# Patient Record
Sex: Female | Born: 1960
Health system: Southern US, Community
[De-identification: ages and names within clinical notes are randomized; demographics above are authoritative.]

## PROBLEM LIST (undated history)

## (undated) DIAGNOSIS — T7840XA Allergy, unspecified, initial encounter: Secondary | ICD-10-CM

## (undated) DIAGNOSIS — E119 Type 2 diabetes mellitus without complications: Secondary | ICD-10-CM

## (undated) DIAGNOSIS — K219 Gastro-esophageal reflux disease without esophagitis: Secondary | ICD-10-CM

## (undated) HISTORY — DX: Allergy, unspecified, initial encounter: T78.40XA

## (undated) HISTORY — DX: Type 2 diabetes mellitus without complications: E11.9

## (undated) HISTORY — PX: TUBAL LIGATION: SHX77

---

## 1999-03-25 ENCOUNTER — Other Ambulatory Visit: Admission: RE | Admit: 1999-03-25 | Discharge: 1999-03-25 | Payer: Self-pay | Admitting: Gynecology

## 2001-11-28 ENCOUNTER — Emergency Department (HOSPITAL_COMMUNITY): Admission: EM | Admit: 2001-11-28 | Discharge: 2001-11-28 | Payer: Self-pay | Admitting: Emergency Medicine

## 2001-11-28 ENCOUNTER — Encounter: Payer: Self-pay | Admitting: Internal Medicine

## 2002-03-28 ENCOUNTER — Ambulatory Visit (HOSPITAL_COMMUNITY): Admission: RE | Admit: 2002-03-28 | Discharge: 2002-03-28 | Payer: Self-pay | Admitting: Infectious Diseases

## 2002-03-28 ENCOUNTER — Encounter: Payer: Self-pay | Admitting: Infectious Diseases

## 2002-12-12 ENCOUNTER — Other Ambulatory Visit: Admission: RE | Admit: 2002-12-12 | Discharge: 2002-12-12 | Payer: Self-pay | Admitting: Family Medicine

## 2002-12-19 ENCOUNTER — Ambulatory Visit (HOSPITAL_COMMUNITY): Admission: RE | Admit: 2002-12-19 | Discharge: 2002-12-19 | Payer: Self-pay | Admitting: Family Medicine

## 2002-12-19 ENCOUNTER — Encounter: Payer: Self-pay | Admitting: Family Medicine

## 2004-02-08 ENCOUNTER — Other Ambulatory Visit: Admission: RE | Admit: 2004-02-08 | Discharge: 2004-02-08 | Payer: Self-pay | Admitting: Family Medicine

## 2005-04-17 ENCOUNTER — Other Ambulatory Visit: Admission: RE | Admit: 2005-04-17 | Discharge: 2005-04-17 | Payer: Self-pay | Admitting: Family Medicine

## 2006-04-18 ENCOUNTER — Other Ambulatory Visit: Admission: RE | Admit: 2006-04-18 | Discharge: 2006-04-18 | Payer: Self-pay | Admitting: Family Medicine

## 2007-04-22 ENCOUNTER — Other Ambulatory Visit: Admission: RE | Admit: 2007-04-22 | Discharge: 2007-04-22 | Payer: Self-pay | Admitting: Family Medicine

## 2008-04-03 ENCOUNTER — Encounter: Admission: RE | Admit: 2008-04-03 | Discharge: 2008-04-03 | Payer: Self-pay | Admitting: Cardiology

## 2009-10-29 ENCOUNTER — Other Ambulatory Visit: Admission: RE | Admit: 2009-10-29 | Discharge: 2009-10-29 | Payer: Self-pay | Admitting: Family Medicine

## 2010-08-12 NOTE — Consult Note (Signed)
[name], Delgado NO.:  [id number]   MEDICAL RECORD NO.:  [id number]                   PATIENT TYPE:  EMS   LOCATION:  [facility]                                 FACILITY:  [facility]   PHYSICIAN:  Jackie Plum, M.D.             DATE OF BIRTH:  October 05, 1960   DATE OF CONSULTATION:  11/28/2001  DATE OF DISCHARGE:                                   CONSULTATION   CHIEF COMPLAINT:  Shortness of breath, palpitations, dizziness.   HISTORY OF PRESENT ILLNESS:  A 50 year old ________ lady with history of  diabetes mellitus, who presented with shortness of breath, palpitations,  dizziness of about two hours' duration while she was in bed sleeping.  No  history of nausea, vomiting, chest pain, cough, or sputum production.  NO  history of fever but experienced transient shaking chills.  No history of  vertiginous component to dizziness, no history of loss of consciousness.  According to the patient's husband, this is the third time the patient has  experienced such an episode of palpitations and dizziness with some  shortness of breath over the last two years.  Symptoms were improved at the  time of my evaluation and actually resolved prior to my discharge from the  ED.   PAST MEDICAL HISTORY:  Notable for diabetes mellitus for three years on  Glucophage.  No history of heart disease, hypertension, or  hypercholesterolemia.   ALLERGIES:  No history of medication allergies.   MEDICATIONS:  The patient takes Glucophage 1500 mg p.o. q.d. (the patient  did not take any medication last night because her medication pills had  expired).   FAMILY HISTORY:  Unremarkable for any heart disease or hypertension.   SOCIAL HISTORY:  The patient is married with two children.  She is a Consulting civil engineer  at [employer].  Her husband is a professor at the business school  at [employer].  She does not smoke cigarettes and does not drink alcohol.   REVIEW OF SYMPTOMS:  Significant  positives and negatives are as in HPI.  Otherwise they were unremarkable.   PHYSICAL EXAMINATION:  VITAL SIGNS:  BP standing was 110/80, lying was  120/90, pulse rate standing was 83 and lying was 80, respiratory rate of 22  per minute, temperature 97.6 degrees Fahrenheit, O2 saturation of 99% on  room air.  GENERAL:  The patient was not toxic-looking.  She was not in acute  cardiopulmonary distress.  HEENT:  Normocephalic, atraumatic head.  Pupils were equal, round, and  reactive to light.  Extraocular movements were intact.  Oropharynx was moist  without any exudation.  NECK:  No thyromegaly, JVD, or cervical lymphadenopathy.  Neck was supple.  CHEST:  Vesicular breath sounds without any rales or wheezes.  CARDIAC:  Regular rate and rhythm, S2 and S2 are present.  No gallops or  murmurs.  ABDOMEN:  Full, normoactive bowel sounds were present,  nontender, no  hepatosplenomegaly.  EXTREMITIES:  No cyanosis or edema.  Dorsalis pedis pulses were present.  They were full, about 2+.  NEUROLOGIC:  The patient was alert and oriented x3.  No obvious focal  deficits.   LABORATORY DATA:  EKG was notable for normal sinus rhythm at 75 per minute,  no acute ST-T wave changes.  Chest CT discussed with Dr. Kearney Hard of radiology  negative for PE, no acute pulmonary process.   WBC count was 7.2, hemoglobin 14.4, hematocrit 32.7, MCV 92.1, platelet  count 102.  Sodium 137, potassium 3.9, chloride 104, CO2 26, BUN 18,  creatinine 0.9, glucose was 140.  Calcium was 9.5.  Albumin 3.7, total  protein 6.9, SGOT 21, SGPT 25, alkaline phosphatase 60, total bilirubin 0.5.  UA was negative for any UTI.  Pregnancy test was negative.  CPK total was  102, MB was 1.2.  Hemoglobin A1C is pending.   ASSESSMENT:  This is a 50 year old lady with a history of diabetes, no prior  significant cardiopulmonary disease, who presents with acute onset of  shortness of breath with palpitations and dizziness, without any  vertiginous  component.  This is the third episode of such symptomatology over the last  two years.  The patient had  not taken her medication of Glucophage and had  experienced those symptoms a few minutes after dinner.  Workup as mentioned  above was unremarkable at this point, because of patient's transitory  symptomatology is unclear.  The possibilities include an episode of  hyperglycemia or hypoglycemia.   PLAN:  The patient will be sent home on aspirin and to schedule an  outpatient stress test with negative chest CT, on account of recurrence of  this episode.  The patient is discharged home in stable condition.  She is  hemodynamically stable.  She does not have any symptomatology with respect  to shortness of breath, dizziness, palpitations.  Her symptoms had  completely resolved.                                               Jackie Plum, M.D.    GO/MEDQ  D:  11/28/2001  T:  11/28/2001  Job:  [id number]   cc:   Duncan Dull, M.D.  [address]  [place]  [place] [address]  Fax: [phone]

## 2011-02-21 ENCOUNTER — Other Ambulatory Visit: Payer: Self-pay | Admitting: Family Medicine

## 2011-02-21 ENCOUNTER — Other Ambulatory Visit (HOSPITAL_COMMUNITY): Payer: Self-pay | Admitting: Family Medicine

## 2011-02-21 DIAGNOSIS — R2230 Localized swelling, mass and lump, unspecified upper limb: Secondary | ICD-10-CM

## 2011-02-21 DIAGNOSIS — IMO0002 Reserved for concepts with insufficient information to code with codable children: Secondary | ICD-10-CM

## 2011-02-28 ENCOUNTER — Other Ambulatory Visit (HOSPITAL_COMMUNITY): Payer: Self-pay | Admitting: Family Medicine

## 2011-02-28 ENCOUNTER — Ambulatory Visit (HOSPITAL_COMMUNITY)
Admission: RE | Admit: 2011-02-28 | Discharge: 2011-02-28 | Disposition: A | Discharge status: Home / Self Care | Payer: 59 | Source: Ambulatory Visit | Attending: Family Medicine | Admitting: Family Medicine

## 2011-02-28 DIAGNOSIS — R2230 Localized swelling, mass and lump, unspecified upper limb: Secondary | ICD-10-CM

## 2011-02-28 DIAGNOSIS — R229 Localized swelling, mass and lump, unspecified: Secondary | ICD-10-CM | POA: Insufficient documentation

## 2012-01-03 ENCOUNTER — Ambulatory Visit
Admission: RE | Admit: 2012-01-03 | Discharge: 2012-01-03 | Disposition: A | Discharge status: Home / Self Care | Payer: No Typology Code available for payment source | Source: Ambulatory Visit | Attending: Infectious Diseases | Admitting: Infectious Diseases

## 2012-01-03 ENCOUNTER — Other Ambulatory Visit: Payer: Self-pay | Admitting: Infectious Diseases

## 2012-01-03 DIAGNOSIS — R7612 Nonspecific reaction to cell mediated immunity measurement of gamma interferon antigen response without active tuberculosis: Secondary | ICD-10-CM

## 2013-01-30 ENCOUNTER — Other Ambulatory Visit: Payer: Self-pay | Admitting: Family Medicine

## 2013-01-30 ENCOUNTER — Other Ambulatory Visit (HOSPITAL_COMMUNITY)
Admission: RE | Admit: 2013-01-30 | Discharge: 2013-01-30 | Disposition: A | Discharge status: Home / Self Care | Payer: 59 | Source: Ambulatory Visit | Attending: Family Medicine | Admitting: Family Medicine

## 2013-01-30 DIAGNOSIS — Z124 Encounter for screening for malignant neoplasm of cervix: Secondary | ICD-10-CM | POA: Insufficient documentation

## 2013-01-30 DIAGNOSIS — Z1151 Encounter for screening for human papillomavirus (HPV): Secondary | ICD-10-CM | POA: Insufficient documentation

## 2013-05-03 ENCOUNTER — Encounter: Payer: 59 | Attending: Family Medicine

## 2013-05-03 ENCOUNTER — Ambulatory Visit: Payer: 59

## 2013-05-03 VITALS — Ht 65.0 in | Wt 207.9 lb

## 2013-05-03 DIAGNOSIS — E119 Type 2 diabetes mellitus without complications: Secondary | ICD-10-CM | POA: Insufficient documentation

## 2013-05-03 NOTE — Progress Notes (Signed)
Patient was seen on 05/03/13 for the complete diabetes self-management series at the [facility].  Current A1c = unknown   Handouts given during class include:  Living Well with Diabetes book  Carb Counting and Meal Planning book  Meal Plan Card  Carbohydrate guide  Meal planning worksheet  Low Sodium Flavoring Tips  The diabetes portion plate  Low Carbohydrate Snack Suggestions  A1c to eAG Conversion Chart  Diabetes Medications  Stress Management  Diabetes Recommended Care Schedule  Diabetes Success Plan  Core Class Satisfaction Survey  The following learning objectives were met by the patient during this course:  Describe diabetes  State some common risk factors for diabetes  Defines the role of glucose and insulin  Identifies type of diabetes and pathophysiology  Describe the relationship between diabetes and cardiovascular risk  State the members of the Healthcare Team  States the rationale for glucose monitoring  State when to test glucose  State their individual Target Range  State the importance of logging glucose readings  Describe how to interpret glucose readings  Identifies A1C target  Explain the correlation between A1c and eAG values  State symptoms and treatment of high blood glucose  State symptoms and treatment of low blood glucose  Explain proper technique for glucose testing  Identifies proper sharps disposal  Describe the role of different macronutrients on glucose  Explain how carbohydrates affect blood glucose  State what foods contain the most carbohydrates  Demonstrate carbohydrate counting  Demonstrate how to read Nutrition Facts food label  Describe effects of various fats on heart health  Describe the importance of good nutrition for health and healthy eating strategies  Describe techniques for managing your shopping, cooking and meal planning  List strategies to follow meal plan  when dining out  Describe the effects of alcohol on glucose and how to use it safely   State the amount of activity recommended for healthy living   Describe activities suitable for individual needs   Identify ways to regularly incorporate activity into daily life   Identify barriers to activity and ways to over come these barriers  Identify diabetes medications being personally used and their primary action for lowering glucose and possible side effects   Describe role of stress on blood glucose and develop strategies to address psychosocial issues   Identify diabetes complications and ways to prevent them  Explain how to manage diabetes during illness   Evaluate success in meeting personal goal   Establish 2-3 goals that they will plan to diligently work on until they return for the  15-monthfollow-up visit  Goals:  Follow Diabetes Meal Plan as instructed  Eat 3 meals and 2 snacks, every 3-5 hrs  Limit carbohydrate intake to 45 grams carbohydrate/meal Limit carbohydrate intake to 15 grams carbohydrate/snack Add lean protein foods to meals/snacks  Monitor glucose levels as instructed by your doctor  Aim for 15-30 mins of physical activity daily as tolerated  Bring food record and glucose log to your next nutrition visit  Your patient has established the following 4 month goals in their individualized success plan:  Count carbohydrates at most meals and snacks  Reduce fat in my diet by eating less fried foods  Be active 30 minutes or more 4 times a week  Your patient has identified these potential barriers to change:  Work schedule  Doesn't cook when she works  Your patient has identified their diabetes self-care support plan as  webistes  husband  [place] support group

## 2013-06-23 ENCOUNTER — Other Ambulatory Visit: Payer: Self-pay | Admitting: Gastroenterology

## 2013-07-22 ENCOUNTER — Encounter (HOSPITAL_COMMUNITY): Payer: Self-pay | Admitting: Pharmacy Technician

## 2013-07-24 ENCOUNTER — Ambulatory Visit: Payer: Self-pay | Admitting: Podiatry

## 2013-08-05 ENCOUNTER — Encounter (HOSPITAL_COMMUNITY): Payer: Self-pay | Admitting: *Deleted

## 2013-08-06 ENCOUNTER — Encounter: Payer: Self-pay | Admitting: Podiatry

## 2013-08-06 ENCOUNTER — Ambulatory Visit (INDEPENDENT_AMBULATORY_CARE_PROVIDER_SITE_OTHER): Payer: 59 | Admitting: Podiatry

## 2013-08-06 ENCOUNTER — Ambulatory Visit (INDEPENDENT_AMBULATORY_CARE_PROVIDER_SITE_OTHER): Payer: 59

## 2013-08-06 VITALS — BP 118/74 | HR 87 | Resp 16

## 2013-08-06 DIAGNOSIS — M779 Enthesopathy, unspecified: Secondary | ICD-10-CM

## 2013-08-06 DIAGNOSIS — IMO0002 Reserved for concepts with insufficient information to code with codable children: Secondary | ICD-10-CM

## 2013-08-06 NOTE — Progress Notes (Signed)
Subjective:     Patient ID: Mary Delgado, female   DOB: 17-Apr-1960, 53 y.o.   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 Has tried warming pads without relief   Review of Systems  All other systems reviewed and are negative.      Objective:   Physical Exam  Nursing note and vitals reviewed. Constitutional: She is oriented to person, place, and time.  Cardiovascular: Intact distal pulses.   Musculoskeletal: Normal range of motion.  Neurological: She is oriented to person, place, and time.  Skin: Skin is dry.   neurovascular status intact with range of motion mildly diminished and muscle strength within normal limits both feet. Patient is found to have discomfort in the second metatarsal phalangeal joint right over left with inflammation noted and is found to have good digital perfusion with no indications of other pathology     Assessment:     Capsulitis with long-term diabetic who appears to have good foot function at this time    Plan:     H&P and x-rays were reviewed and at this time I recommended orthotics to reduce stress against the plantar feet. Scanned for orthotics at this time

## 2013-08-06 NOTE — Progress Notes (Signed)
   Subjective:    Patient ID: Mary Delgado, female    DOB: November 22, 1960, 53 y.o.   MRN: [id number]  HPI Comments: "I have pain under the toes"  Patient c/o burning, aching plantar forefoot bilateral, right over left, for about 1-2 months. She only experiences this when she has been on her feet for long periods. She works 12 hr shifts and is very uncomfortable when she leaves work. Tried Vicks rub and "warming pads"-states that the warming pads help some.  Foot Pain      Review of Systems  Musculoskeletal: Positive for back pain.  Allergic/Immunologic: Positive for environmental allergies.  All other systems reviewed and are negative.      Objective:   Physical Exam        Assessment & Plan:

## 2013-08-12 ENCOUNTER — Encounter (HOSPITAL_COMMUNITY)
Admission: RE | Disposition: A | Discharge status: Home / Self Care | Payer: Self-pay | Source: Ambulatory Visit | Attending: Gastroenterology

## 2013-08-12 ENCOUNTER — Ambulatory Visit (HOSPITAL_COMMUNITY)
Admission: RE | Admit: 2013-08-12 | Discharge: 2013-08-12 | Disposition: A | Discharge status: Home / Self Care | Payer: 59 | Source: Ambulatory Visit | Attending: Gastroenterology | Admitting: Gastroenterology

## 2013-08-12 ENCOUNTER — Encounter (HOSPITAL_COMMUNITY): Payer: 59 | Admitting: Anesthesiology

## 2013-08-12 ENCOUNTER — Ambulatory Visit (HOSPITAL_COMMUNITY): Payer: 59 | Admitting: Anesthesiology

## 2013-08-12 ENCOUNTER — Encounter (HOSPITAL_COMMUNITY): Payer: Self-pay

## 2013-08-12 DIAGNOSIS — L259 Unspecified contact dermatitis, unspecified cause: Secondary | ICD-10-CM | POA: Insufficient documentation

## 2013-08-12 DIAGNOSIS — Z1211 Encounter for screening for malignant neoplasm of colon: Secondary | ICD-10-CM | POA: Insufficient documentation

## 2013-08-12 DIAGNOSIS — K219 Gastro-esophageal reflux disease without esophagitis: Secondary | ICD-10-CM | POA: Insufficient documentation

## 2013-08-12 DIAGNOSIS — J309 Allergic rhinitis, unspecified: Secondary | ICD-10-CM | POA: Insufficient documentation

## 2013-08-12 DIAGNOSIS — E11319 Type 2 diabetes mellitus with unspecified diabetic retinopathy without macular edema: Secondary | ICD-10-CM | POA: Insufficient documentation

## 2013-08-12 DIAGNOSIS — Z888 Allergy status to other drugs, medicaments and biological substances status: Secondary | ICD-10-CM | POA: Insufficient documentation

## 2013-08-12 DIAGNOSIS — E1139 Type 2 diabetes mellitus with other diabetic ophthalmic complication: Secondary | ICD-10-CM | POA: Insufficient documentation

## 2013-08-12 HISTORY — DX: Gastro-esophageal reflux disease without esophagitis: K21.9

## 2013-08-12 HISTORY — PX: COLONOSCOPY WITH PROPOFOL: SHX5780

## 2013-08-12 LAB — GLUCOSE, CAPILLARY: Glucose-Capillary: 189 mg/dL — ABNORMAL HIGH (ref 70–99)

## 2013-08-12 SURGERY — COLONOSCOPY WITH PROPOFOL
Anesthesia: Monitor Anesthesia Care

## 2013-08-12 MED ORDER — PROPOFOL 10 MG/ML IV BOLUS
INTRAVENOUS | Status: AC
  Filled 2013-08-12: qty 20

## 2013-08-12 MED ORDER — LACTATED RINGERS IV SOLN
INTRAVENOUS | Status: DC
  Administered 2013-08-12: 08:00:00 via INTRAVENOUS

## 2013-08-12 MED ORDER — SODIUM CHLORIDE 0.9 % IV SOLN: INTRAVENOUS | Status: DC

## 2013-08-12 MED ORDER — PROPOFOL 10 MG/ML IV BOLUS
INTRAVENOUS | Status: DC | PRN
  Administered 2013-08-12: 25 mg via INTRAVENOUS
  Administered 2013-08-12 (×2): 50 mg via INTRAVENOUS
  Administered 2013-08-12: 25 mg via INTRAVENOUS
  Administered 2013-08-12 (×2): 50 mg via INTRAVENOUS
  Administered 2013-08-12 (×3): 25 mg via INTRAVENOUS

## 2013-08-12 SURGICAL SUPPLY — 22 items

## 2013-08-12 NOTE — H&P (Signed)
  Procedure: Baseline screening colonoscopy  History: The patient is a 53 year old female born 1961/02/07. She is scheduled to undergo her first screening colonoscopy with polypectomy to prevent colon cancer.  Past medical history: Bilateral tubal ligation. Eczema. Type 2 diabetes mellitus with diabetic retinopathy. Allergic rhinitis. Gastroesophageal reflux.  Medication allergies: Metformin caused medication side effects. Byetta caused headaches and tachycardia  Exam: The patient is alert and lying comfortably on the endoscopy stretcher. Lungs are clear to auscultation. Cardiac exam reveals a regular rhythm. Abdomen is soft nontender to palpation  Plan: Proceed with baseline screening colonoscopy

## 2013-08-12 NOTE — Transfer of Care (Signed)
Immediate Anesthesia Transfer of Care Note  Patient: Mary Delgado  Procedure(s) Performed: Procedure(s): COLONOSCOPY WITH PROPOFOL (N/A)  Patient Location: PACU  Anesthesia Type:MAC  Level of Consciousness: awake, sedated and patient cooperative  Airway & Oxygen Therapy: Patient Spontanous Breathing and Patient connected to face mask oxygen  Post-op Assessment: Report given to PACU RN and Post -op Vital signs reviewed and stable  Post vital signs: Reviewed and stable  Complications: No apparent anesthesia complications

## 2013-08-12 NOTE — Anesthesia Preprocedure Evaluation (Signed)
Anesthesia Evaluation  Patient identified by MRN, date of birth, ID band Patient awake    Reviewed: Allergy & Precautions, H&P , NPO status , Patient's Chart, lab work & pertinent test results  Airway Mallampati: II TM Distance: >3 FB Neck ROM: Full    Dental no notable dental hx.    Pulmonary neg pulmonary ROS,  breath sounds clear to auscultation  Pulmonary exam normal       Cardiovascular negative cardio ROS  Rhythm:Regular Rate:Normal     Neuro/Psych negative neurological ROS  negative psych ROS   GI/Hepatic Neg liver ROS, GERD-  Medicated,  Endo/Other  diabetes, Insulin Dependent  Renal/GU negative Renal ROS  negative genitourinary   Musculoskeletal negative musculoskeletal ROS (+)   Abdominal   Peds negative pediatric ROS (+)  Hematology negative hematology ROS (+)   Anesthesia Other Findings   Reproductive/Obstetrics negative OB ROS                           Anesthesia Physical Anesthesia Plan  ASA: III  Anesthesia Plan: MAC   Post-op Pain Management:    Induction: Intravenous  Airway Management Planned: Nasal Cannula  Additional Equipment:   Intra-op Plan:   Post-operative Plan:   Informed Consent: I have reviewed the patients History and Physical, chart, labs and discussed the procedure including the risks, benefits and alternatives for the proposed anesthesia with the patient or authorized representative who has indicated his/her understanding and acceptance.   Dental advisory given  Plan Discussed with: CRNA and Surgeon  Anesthesia Plan Comments:         Anesthesia Quick Evaluation

## 2013-08-12 NOTE — Op Note (Signed)
Procedure: Baseline screening colonoscopy  Endoscopist: Earle Gell  Premedication: Propofol administered by anesthesia  Procedure: The patient was placed in the left lateral decubitus position. Anal inspection and digital rectal exam were normal. The Pentax pediatric colonoscope was introduced into the rectum and advanced to the cecum. A normal-appearing appendiceal orifice and ileocecal valve were identified. Colonic preparation for the exam today was good.  Rectum. Normal. Retroflexed view of the distal rectum normal  Sigmoid colon and descending colon normal  Splenic flexure. Normal  Transverse colon. Normal  Hepatic flexure. Normal  Ascending colon. Normal  Cecum and ileocecal valve. Normal  Assessment: Normal baseline screening colonoscopy  Recommendation: Schedule repeat screening colonoscopy in 10 years

## 2013-08-12 NOTE — Anesthesia Postprocedure Evaluation (Signed)
  Anesthesia Post-op Note  Patient: Mary Delgado  Procedure(s) Performed: Procedure(s) (LRB): COLONOSCOPY WITH PROPOFOL (N/A)  Patient Location: PACU  Anesthesia Type: MAC  Level of Consciousness: awake and alert   Airway and Oxygen Therapy: Patient Spontanous Breathing  Post-op Pain: mild  Post-op Assessment: Post-op Vital signs reviewed, Patient's Cardiovascular Status Stable, Respiratory Function Stable, Patent Airway and No signs of Nausea or vomiting  Last Vitals:  Filed Vitals:   08/12/13 0910  BP: 134/80  Pulse: 74  Temp:   Resp: 16    Post-op Vital Signs: stable   Complications: No apparent anesthesia complications

## 2013-08-13 ENCOUNTER — Encounter (HOSPITAL_COMMUNITY): Payer: Self-pay | Admitting: Gastroenterology

## 2013-08-29 ENCOUNTER — Other Ambulatory Visit: Payer: 59

## 2013-09-01 ENCOUNTER — Ambulatory Visit: Payer: 59 | Admitting: *Deleted

## 2013-09-01 DIAGNOSIS — M779 Enthesopathy, unspecified: Secondary | ICD-10-CM

## 2013-09-01 NOTE — Progress Notes (Signed)
   Subjective:    Patient ID: Mary Delgado, female    DOB: 11-Feb-1961, 53 y.o.   MRN: [id number]  HPI PICK UP ORTHOTICS AND GIVEN INSTRUCTION.    Review of Systems     Objective:   Physical Exam        Assessment & Plan:

## 2013-09-01 NOTE — Patient Instructions (Signed)

## 2014-02-05 ENCOUNTER — Other Ambulatory Visit: Payer: Self-pay

## 2014-02-05 DIAGNOSIS — Z1231 Encounter for screening mammogram for malignant neoplasm of breast: Secondary | ICD-10-CM

## 2014-02-10 ENCOUNTER — Other Ambulatory Visit: Payer: Self-pay | Admitting: Family Medicine

## 2014-02-13 ENCOUNTER — Ambulatory Visit (HOSPITAL_COMMUNITY)
Admission: RE | Admit: 2014-02-13 | Discharge: 2014-02-13 | Disposition: A | Discharge status: Home / Self Care | Payer: 59 | Source: Ambulatory Visit | Attending: Family Medicine | Admitting: Family Medicine

## 2014-02-13 ENCOUNTER — Other Ambulatory Visit (HOSPITAL_COMMUNITY): Payer: Self-pay | Admitting: Family Medicine

## 2014-02-13 DIAGNOSIS — M2550 Pain in unspecified joint: Secondary | ICD-10-CM

## 2014-02-13 DIAGNOSIS — M25511 Pain in right shoulder: Secondary | ICD-10-CM | POA: Diagnosis present

## 2014-02-13 DIAGNOSIS — M542 Cervicalgia: Secondary | ICD-10-CM | POA: Diagnosis present

## 2014-02-16 ENCOUNTER — Other Ambulatory Visit: Payer: Self-pay | Admitting: Obstetrics & Gynecology

## 2014-02-16 ENCOUNTER — Other Ambulatory Visit (HOSPITAL_COMMUNITY)
Admission: RE | Admit: 2014-02-16 | Discharge: 2014-02-16 | Disposition: A | Discharge status: Home / Self Care | Payer: 59 | Source: Ambulatory Visit | Attending: Obstetrics & Gynecology | Admitting: Obstetrics & Gynecology

## 2014-02-16 DIAGNOSIS — Z01419 Encounter for gynecological examination (general) (routine) without abnormal findings: Secondary | ICD-10-CM | POA: Diagnosis not present

## 2014-02-17 LAB — CYTOLOGY - PAP

## 2014-02-27 ENCOUNTER — Ambulatory Visit
Admission: RE | Admit: 2014-02-27 | Discharge: 2014-02-27 | Disposition: A | Discharge status: Home / Self Care | Payer: 59 | Source: Ambulatory Visit

## 2014-02-27 DIAGNOSIS — Z1231 Encounter for screening mammogram for malignant neoplasm of breast: Secondary | ICD-10-CM

## 2014-07-06 ENCOUNTER — Ambulatory Visit: Payer: Self-pay

## 2014-07-20 ENCOUNTER — Other Ambulatory Visit: Payer: Self-pay

## 2014-07-20 VITALS — BP 110/78 | HR 85 | Ht 64.5 in | Wt 206.2 lb

## 2014-07-20 DIAGNOSIS — E119 Type 2 diabetes mellitus without complications: Secondary | ICD-10-CM | POA: Insufficient documentation

## 2014-07-20 NOTE — Patient Instructions (Signed)
Diabetes and Exercise Exercising regularly is important. It is not just about losing weight. It has many health benefits, such as:  Improving your overall fitness, flexibility, and endurance.  Increasing your bone density.  Helping with weight control.  Decreasing your body fat.  Increasing your muscle strength.  Reducing stress and tension.  Improving your overall health. People with diabetes who exercise gain additional benefits because exercise:  Reduces appetite.  Improves the body's use of blood sugar (glucose).  Helps lower or control blood glucose.  Decreases blood pressure.  Helps control blood lipids (such as cholesterol and triglycerides).  Improves the body's use of the hormone insulin by:  Increasing the body's insulin sensitivity.  Reducing the body's insulin needs.  Decreases the risk for heart disease because exercising:  Lowers cholesterol and triglycerides levels.  Increases the levels of good cholesterol (such as high-density lipoproteins [HDL]) in the body.  Lowers blood glucose levels. YOUR ACTIVITY PLAN  Choose an activity that you enjoy and set realistic goals. Your health care provider or diabetes educator can help you make an activity plan that works for you. Exercise regularly as directed by your health care provider. This includes:  Performing resistance training twice a week such as push-ups, sit-ups, lifting weights, or using resistance bands.  Performing 150 minutes of cardio exercises each week such as walking, running, or playing sports.  Staying active and spending no more than 90 minutes at one time being inactive. Even short bursts of exercise are good for you. Three 10-minute sessions spread throughout the day are just as beneficial as a single 30-minute session. Some exercise ideas include:  Taking the dog for a walk.  Taking the stairs instead of the elevator.  Dancing to your favorite song.  Doing an exercise  video.  Doing your favorite exercise with a friend. RECOMMENDATIONS FOR EXERCISING WITH TYPE 1 OR TYPE 2 DIABETES   Check your blood glucose before exercising. If blood glucose levels are greater than 240 mg/dL, check for urine ketones. Do not exercise if ketones are present.  Avoid injecting insulin into areas of the body that are going to be exercised. For example, avoid injecting insulin into:  The arms when playing tennis.  The legs when jogging.  Keep a record of:  Food intake before and after you exercise.  Expected peak times of insulin action.  Blood glucose levels before and after you exercise.  The type and amount of exercise you have done.  Review your records with your health care provider. Your health care provider will help you to develop guidelines for adjusting food intake and insulin amounts before and after exercising.  If you take insulin or oral hypoglycemic agents, watch for signs and symptoms of hypoglycemia. They include:  Dizziness.  Shaking.  Sweating.  Chills.  Confusion.  Drink plenty of water while you exercise to prevent dehydration or heat stroke. Body water is lost during exercise and must be replaced.  Talk to your health care provider before starting an exercise program to make sure it is safe for you. Remember, almost any type of activity is better than none. Document Released: 06/03/2003 Document Revised: 07/28/2013 Document Reviewed: 08/20/2012 ExitCare Patient Information 2015 [employer]. This information is not intended to replace advice given to you by your health care provider. Make sure you discuss any questions you have with your health care provider.  

## 2014-07-20 NOTE — Patient Outreach (Signed)
[name]) Care Management   07/20/2014  Mary Delgado 1961-01-06 [id number]  Mary Delgado is an 54 y.o. female.   Member seen for follow up office visit for Link to Wellness program for self management of Type 2 diabetes   Subjective: member states that she is getting use to working day shift.  States that she is having difficulty getting away to eat lunch so she is sometimes not eating until 3 PM.  States that she is eating dinner when she gets home around 8:30PM.  States that her blood sugars are elevated after eating late and her morning sugars are up.  States that on the days she is off her blood sugars are in range.       Objective:   Review of Systems  All other systems reviewed and are negative.   Physical Exam  Filed Vitals:   07/20/14 1408  BP: 110/78  Pulse: 85   Filed Weights   07/20/14 1408  Weight: 206 lb 3.2 oz (93.532 kg)    Current Medications:   Current Outpatient Prescriptions  Medication Sig Dispense Refill  . aspirin EC 81 MG tablet Take 81 mg by mouth daily.    . Canagliflozin (INVOKANA) 100 MG TABS Take 300 mg by mouth daily.     . cetirizine (ZYRTEC) 10 MG tablet Take 10 mg by mouth as needed for allergies.    . hydroxypropyl methylcellulose (ISOPTO TEARS) 2.5 % ophthalmic solution Place 1 drop into both eyes 3 (three) times daily as needed for dry eyes.    . Insulin Aspart (NOVOLOG FLEXPEN [place]) Inject 10 Units into the skin 3 (three) times daily before meals.     . insulin glargine (LANTUS) 100 UNIT/ML injection Inject 20 Units into the skin at bedtime.     Marland Kitchen lisinopril (PRINIVIL,ZESTRIL) 10 MG tablet Take 10 mg by mouth every evening.     . Multiple Vitamin (MULTIVITAMIN WITH MINERALS) TABS tablet Take 1 tablet by mouth daily.    . naproxen sodium (ANAPROX) 220 MG tablet Take 220 mg by mouth as needed.    . pantoprazole (PROTONIX) 40 MG tablet Take 40 mg by mouth as needed.      No current facility-administered medications for this  visit.    Functional Status:   In your present state of health, do you have any difficulty performing the following activities: 07/20/2014  Hearing? N  Vision? N  Difficulty concentrating or making decisions? N  Walking or climbing stairs? N  Dressing or bathing? N  Doing errands, shopping? N    Fall/Depression Screening:    PHQ 2/9 Scores 07/20/2014  PHQ - 2 Score 0   [facility] CM Care Plan        Patient Outreach from 07/20/2014 in [place] Problem One  Elevated blood sugars related to dx of Type 2 DM as evidenced by hemoglobin A1C of 7.9    Care Plan for Problem One  Active   Interventions for Problem One Long Term Goal  Reviewed CHO counting and portion control especially for rice and potatoes, Instructed to work with her coworkers to arrange to take  the first lunch break and to try to eat  her dinner at 5 PM when at work,  Reinforced  to continue to  walk  4 times a week and to increase time if possible, Reviewed s/s of hypoglycemia and actions to take, Encouraged to discuss with Dr.Kerr about her elevated blood sugars on the  days she works and if she needs more  insulin on  her  08/04/14 appt with him   [facility] Long Term Goal (31-90 days)  Member will see a decrease in her Hemoglobin A1C within the next 90 days   [facility] Long Term Goal Start Date  07/20/14      Assessment:  Member seen for Link to Wellness follow up visit for self management of Type 2 DM, Member has been adjusting to new work schedule.  She has not been eating at regular times and she has been eating late.  Her CGBs have been higher on the days she works.  She has increased her walking to 4 times a week.  She never tried the [facility] and she is still taking Lantus insulin 20 units daily.    Plan:  Plan to limit carbohydrates to 30-45GM (2-3 servings) at meal and 15GM for snacks. Plan to eat at scheduled breaks  and eat snacks with protein in between breaks at work.  Plan to eat meal with carbohydrate and  protein.   Continue to check blood sugars 3-4 times a day with goal of 80-130 before meals and less than 180 1  to 2 hours after eating.   Plan to walk 4 times a week for 30 minutes.  Try to work up to 150 minutes a week.  Plan to see Dr. Buddy Duty  08/04/14       Plan to return to Link to Wellness in September 14, 2014 at 10:30AM

## 2014-08-13 ENCOUNTER — Ambulatory Visit
Admission: RE | Admit: 2014-08-13 | Discharge: 2014-08-13 | Disposition: A | Discharge status: Home / Self Care | Payer: 59 | Source: Ambulatory Visit | Attending: Family Medicine | Admitting: Family Medicine

## 2014-08-13 ENCOUNTER — Other Ambulatory Visit: Payer: Self-pay | Admitting: Family Medicine

## 2014-08-13 DIAGNOSIS — M79675 Pain in left toe(s): Secondary | ICD-10-CM

## 2014-08-28 ENCOUNTER — Other Ambulatory Visit: Payer: Self-pay | Admitting: Family Medicine

## 2014-09-14 ENCOUNTER — Ambulatory Visit: Payer: 59

## 2014-10-05 ENCOUNTER — Ambulatory Visit: Payer: 59

## 2014-10-08 ENCOUNTER — Other Ambulatory Visit: Payer: Self-pay

## 2014-10-08 VITALS — BP 138/80 | HR 78 | Resp 16 | Ht 65.5 in | Wt 203.6 lb

## 2014-10-08 DIAGNOSIS — E119 Type 2 diabetes mellitus without complications: Secondary | ICD-10-CM

## 2014-10-08 NOTE — Patient Outreach (Signed)
[facility]) Care Management   10/08/2014  Mary Delgado Jun 06, 1960 [id number]  Mary Delgado is an 54 y.o. female.   Member seen for follow up office visit for Link to Wellness program for self management of Type 2 diabetes  Subjective: Member states that she saw Dr.Kerr in May and he increased her Lantus and meal time insulin.  States that her blood sugars have been getting better.  States that she has been going to the gym at [employer] 3 times a week for 30 minutes and she walks in the park on her days off.  States she is trying to eat less rice and potatoes and is eating more green vegetables. States that she has had a few low readings when she has not eaten or did not eat enough.    Objective:   Review of Systems  All other systems reviewed and are negative. Reviewed glucometer 7 day average-155 14 day average-173 30 day average-177  Physical Exam  Today's Vitals   10/08/14 1001  BP: 138/80  Pulse: 78  Resp: 16  Height: 1.664 m (5' 5.5")  Weight: 203 lb 9.6 oz (92.352 kg)  SpO2: 99%  PainSc: 0-No pain    Current Medications:   Current Outpatient Prescriptions  Medication Sig Dispense Refill  . aspirin EC 81 MG tablet Take 81 mg by mouth daily.    . Canagliflozin (INVOKANA) 100 MG TABS Take 100 mg by mouth daily.     . cetirizine (ZYRTEC) 10 MG tablet Take 10 mg by mouth as needed for allergies.    . fluticasone (FLONASE) 50 MCG/ACT nasal spray Place 1 spray into both nostrils daily as needed for allergies or rhinitis.    . hydroxypropyl methylcellulose (ISOPTO TEARS) 2.5 % ophthalmic solution Place 1 drop into both eyes 3 (three) times daily as needed for dry eyes.    . Insulin Aspart (NOVOLOG FLEXPEN [place]) Inject 15 Units into the skin 3 (three) times daily before meals.     . insulin glargine (LANTUS) 100 UNIT/ML injection Inject 30 Units into the skin at bedtime.     Marland Kitchen lisinopril (PRINIVIL,ZESTRIL) 10 MG tablet Take 10 mg by mouth every evening.     .  Multiple Vitamin (MULTIVITAMIN WITH MINERALS) TABS tablet Take 1 tablet by mouth daily.    . naproxen sodium (ANAPROX) 220 MG tablet Take 220 mg by mouth as needed.    . pantoprazole (PROTONIX) 40 MG tablet Take 40 mg by mouth as needed.      No current facility-administered medications for this visit.    Functional Status:   In your present state of health, do you have any difficulty performing the following activities: 10/08/2014 07/20/2014  Hearing? N N  Vision? N N  Difficulty concentrating or making decisions? N N  Walking or climbing stairs? N N  Dressing or bathing? N N  Doing errands, shopping? N N    Fall/Depression Screening:    PHQ 2/9 Scores 10/08/2014 07/20/2014  PHQ - 2 Score 0 0   THN CM Care Plan Problem One        Patient Outreach from 10/08/2014 in [place] Problem One  Elevated blood sugars related to dx of Type 2 DM as evidenced by hemoglobin A1C of 7.9    Care Plan for Problem One  Active   THN Long Term Goal (31-90 days)  Member will see a decrease in her Hemoglobin A1C within the next 90 days  THN Long Term Goal Start Date  10/08/14 [facility] had Hemoglobin A1c of 8.3 and medications increased]   Interventions for Problem One Long Term Goal  Reviewed CHO counting and portion control especially for rice and potatoes,  Praised for going to gym and encouraged to increase number of days, time and intensity of work outs, Reviewed s/s of hypoglycemia and actions to take, Instructed to not skip meals or snacks,  Reinforced to keep 11/13/14 appointment with Dr.Kerr      Assessment:   Member seen for follow up office visit for Link to Wellness program for self management of Type 2 diabetes.  Member is not meeting goal of Hemoglobin A1C of 7 with last reading of 8.3.  Member reports increasing her exercise and cutting back on rice/potato portions.  Reports hypoglycemic episodes with CBGs in the low 70s after not eating meals or planned snacks.  Plan:   Plan to limit carbohydrates to 30-45GM (2-3 servings) at meal and 15GM for snacks. Plan to eat at scheduled breaks and eat snacks with protein in between breaks at work.  Plan to eat meal with carbohydrate and protein.   Continue to check blood sugars 3-4 times a day with goal of 80-130 before meals and less than 180 1  to 2 hours after eating.   Plan to go to gym at [facility] 4 days a week for 30-45 minutes.  Plan to continue to walk on days you do not go to gym. Plan to see Dr. Buddy Duty  11/13/14 Plan to return to Link to Wellness on December 10, 2014 at [place] RN, [facility] Care Management Coordinator-Link to [place] Management [phone]

## 2014-12-10 ENCOUNTER — Ambulatory Visit: Payer: 59

## 2014-12-10 ENCOUNTER — Other Ambulatory Visit: Payer: Self-pay

## 2014-12-10 DIAGNOSIS — E119 Type 2 diabetes mellitus without complications: Secondary | ICD-10-CM

## 2014-12-10 NOTE — Patient Outreach (Signed)
[place] [facility]) Care Management  12/10/2014  Mary Delgado Jul 25, 1960 [id number]  Phone call to member as she did not show for scheduled appointment.  No answer and message left.  Member was seen by Dr.Kerr 8/19 and had improved hemoglobin A1C of 7.5.   Plan to reschedule appointment for next month. [name] RN, [facility] Care Management Coordinator-Link to [place] Management [phone]

## 2015-01-11 ENCOUNTER — Telehealth: Payer: Self-pay

## 2015-01-18 ENCOUNTER — Telehealth: Payer: Self-pay

## 2015-01-19 ENCOUNTER — Telehealth: Payer: Self-pay

## 2015-01-21 ENCOUNTER — Other Ambulatory Visit: Payer: Self-pay

## 2015-01-21 VITALS — BP 124/78 | HR 74 | Resp 14 | Ht 65.5 in | Wt 207.4 lb

## 2015-01-21 DIAGNOSIS — Z794 Long term (current) use of insulin: Principal | ICD-10-CM

## 2015-01-21 DIAGNOSIS — E119 Type 2 diabetes mellitus without complications: Secondary | ICD-10-CM

## 2015-01-21 NOTE — Patient Outreach (Signed)
[place] [facility]) Care Management   01/21/2015  Jenae Tomasello 1960-12-13 [id number]  Mary Delgado is an 54 y.o. female.   Member seen for follow up office visit for Link to Wellness program for self management of Type 2 diabetes  Subjective: Member states that she had been having a lot of stress with her daughters wedding and she did not feel her blood sugars were good during this time.  States she has been trying to watch her CHO intake and eat more vegetables.  States she has not been going to the gym to workout for the last month or so but she is walking 3 times a week.  States her blood sugars are high at night but she is checking about 30 minutes after she eats her snack. Objective:   Review of Systems  All other systems reviewed and are negative. Reviewed glucometer 7 day average-185 14 day average-174 30 day average-169  Physical Exam  Today's Vitals   01/21/15 1004  BP: 124/78  Pulse: 74  Resp: 14  Height: 1.664 m (5' 5.5")  Weight: 207 lb 6.4 oz (94.076 kg)  SpO2: 99%  PainSc: 0-No pain    Current Medications:   Current Outpatient Prescriptions  Medication Sig Dispense Refill  . aspirin EC 81 MG tablet Take 81 mg by mouth daily.    . canagliflozin (INVOKANA) 300 MG TABS tablet Take 300 mg by mouth daily before breakfast.    . cetirizine (ZYRTEC) 10 MG tablet Take 10 mg by mouth as needed for allergies.    . fluticasone (FLONASE) 50 MCG/ACT nasal spray Place 1 spray into both nostrils daily as needed for allergies or rhinitis.    . folic acid (FOLVITE) 1 MG tablet Take 1 mg by mouth daily.    . hydroxypropyl methylcellulose (ISOPTO TEARS) 2.5 % ophthalmic solution Place 1 drop into both eyes 3 (three) times daily as needed for dry eyes.    . Insulin Aspart (NOVOLOG FLEXPEN ) Inject 15 Units into the skin 3 (three) times daily before meals.     . insulin glargine (LANTUS) 100 UNIT/ML injection Inject 30 Units into the skin at bedtime.     Marland Kitchen lisinopril  (PRINIVIL,ZESTRIL) 10 MG tablet Take 10 mg by mouth every evening.     . Multiple Vitamin (MULTIVITAMIN WITH MINERALS) TABS tablet Take 1 tablet by mouth daily.    . Omega-3 Fatty Acids (FISH OIL) 500 MG CAPS Take 2 capsules by mouth daily.    . pantoprazole (PROTONIX) 40 MG tablet Take 40 mg by mouth as needed.     . Vitamin D, Cholecalciferol, 1000 UNITS CAPS Take 3 capsules by mouth daily.    . Canagliflozin (INVOKANA) 100 MG TABS Take 100 mg by mouth daily.     . naproxen sodium (ANAPROX) 220 MG tablet Take 220 mg by mouth as needed.     No current facility-administered medications for this visit.    Functional Status:   In your present state of health, do you have any difficulty performing the following activities: 01/21/2015 10/08/2014  Hearing? N N  Vision? N N  Difficulty concentrating or making decisions? N N  Walking or climbing stairs? N N  Dressing or bathing? N N  Doing errands, shopping? N N    Fall/Depression Screening:    PHQ 2/9 Scores 01/21/2015 10/08/2014 07/20/2014  PHQ - 2 Score 0 0 0    Assessment:   Member seen for follow up office visit for Link to [employer]  for self management of Type 2 diabetes.  Member not meeting diabetes self management goal of 7 or below with last hemoglobin A1C of 7.5 which is improved from 8.3.  Member reports limiting CHO and eating more vegetables.  Reports exercising less during stressful time of daughter's wedding.  Plan:   Plan to limit carbohydrates to 30-45GM (2-3 servings) at meal and 15GM for snacks. Plan to continue to eat at scheduled breaks and eat snacks with protein in between breaks at work.  Plan to eat meal with carbohydrate and protein.   Continue to check blood sugars 3-4 times a day with goal of 80-130 before meals and less than 180 1  to 2 hours after eating.   Plan to go to gym at [facility] 2 days a week for 30-45 minutes.  Plan to continue to walk on days you do not go to gym. Plan to see Dr. Buddy Duty   02/24/15 Plan to keep appointment with Dr. Stephanie Acre on 03/08/15 Plan to return to Link to Wellness on  04/19/15 at [place] Problem One        Most Recent Value   Care Plan Problem One  Elevated blood sugars related to dx of Type 2 DM as evidenced by hemoglobin A1C of 7.9    Role Documenting the Problem One  Care Management [place] for Problem One  Active   THN Long Term Goal (31-90 days)  Member will see a decrease in her Hemoglobin A1C within the next 90 days   THN Long Term Goal Start Date  01/21/15 [Member improved Hemoglobin A1c to 7.5]   Interventions for Problem One Long Term Goal  Reviewed CHO counting and portion control, Instructed to try checking PM blood sugar 1 1/2-2 hr  after eating supper instead of 30 minutes after eating snack, Encouraged to resume going to the gym 2-3 times a week and to continue to walk 3 times a week, Reviewed s/s of hypoglycemia and actions to take, Reinforced to not skip meals or snacks,  Reinforced to keep 02/24/15 appointment with Dr.Kerr    [name] RN, BSN,CCM Care Management Coordinator-Link to [place] Management [phone]

## 2015-01-21 NOTE — Patient Instructions (Signed)
1. Plan to limit carbohydrates to 30-45GM (2-3 servings) at meal and 15GM for snacks. 2. Plan to continue to eat at scheduled breaks and eat snacks with protein in between breaks at work.  Plan to eat meal with carbohydrate and protein.   3. Continue to check blood sugars 3-4 times a day with goal of 80-130 before meals and less than 180 1  to 2 hours after eating.   4. Plan to go to gym at [facility] 2 days a week for 30-45 minutes.  Plan to continue to walk on days you do not go to gym. 5. Plan to see Dr. Buddy Duty  02/24/15 6. Plan to keep appointment with Dr. Stephanie Acre 03/08/15 7. Plan to return to Link to Wellness on  04/19/15 at [place]

## 2015-04-09 DIAGNOSIS — E1165 Type 2 diabetes mellitus with hyperglycemia: Secondary | ICD-10-CM | POA: Diagnosis not present

## 2015-04-09 DIAGNOSIS — Z794 Long term (current) use of insulin: Secondary | ICD-10-CM | POA: Diagnosis not present

## 2015-04-12 DIAGNOSIS — J069 Acute upper respiratory infection, unspecified: Secondary | ICD-10-CM | POA: Diagnosis not present

## 2015-04-12 DIAGNOSIS — H6691 Otitis media, unspecified, right ear: Secondary | ICD-10-CM | POA: Diagnosis not present

## 2015-04-12 MED FILL — AMOXICILLIN 500 MG CAPSULE: 500 | 7 days supply | Qty: 42 | Fill #0

## 2015-04-12 MED FILL — PROMETHAZINE-CODEINE SYRUP: 6.25-10 | 5 days supply | Qty: 150 | Fill #0

## 2015-04-19 ENCOUNTER — Other Ambulatory Visit: Payer: Self-pay

## 2015-04-19 VITALS — BP 140/88 | HR 84 | Resp 16 | Ht 65.5 in | Wt 203.8 lb

## 2015-04-19 DIAGNOSIS — E119 Type 2 diabetes mellitus without complications: Secondary | ICD-10-CM

## 2015-04-19 DIAGNOSIS — Z794 Long term (current) use of insulin: Principal | ICD-10-CM

## 2015-04-19 NOTE — Patient Instructions (Signed)
1. Plan to limit carbohydrates to 30-45GM (2-3 servings) at meal and 15GM for snacks. 2. Plan to continue to eat at scheduled breaks and eat snacks with protein in between breaks at work and at bedtime.  Plan to eat meal with carbohydrate and protein.   3. Continue to check blood sugars 3-4 times a day with goal of 80-130 before meals and less than 180 1  to 2 hours after eating.   4. Plan to go to gym at [facility] 2 days a week for 30-45 minutes.  Plan to continue to walk on days you do not go to gym. Plan to do [employer] daily  5. Plan to see Dr. Buddy Duty  07/14/15 6. Plan to return to Link to Wellness on  07/26/15 at [place]

## 2015-04-19 NOTE — Patient Outreach (Signed)
[place] [facility]) Care Management   04/19/2015  Mary Delgado [birth year] [id number]  Mary Delgado is an 55 y.o. female.   Member seen for follow up office visit for Link to Wellness program for self management of Type 2 diabetes  Subjective: Member states that she has been sick with a cold and congestion for about 2-3 weeks.  States that she is on an antibiotic which she will complete today which has helped.  States that her blood sugars have been up some while she was sick.  States that she saw Dr.Kerr on 1/13 and he lowered her Lantus to 24 units to help prevent low readings at night.  States she is to see him again on 07/14/15.  States that she is trying to watch her CHO portions and she is still walking 30-60 minutes 3 times a week.    Objective:   Review of Systems  HENT: Positive for congestion and tinnitus.   All other systems reviewed and are negative. Reviewed glucometer 7 day average-245 14 day average-201 30 day average-188  Physical Exam Today's Vitals   04/19/15 1002  BP: 140/88  Pulse: 84  Resp: 16  Height: 1.664 m (5' 5.5")  Weight: 203 lb 12.8 oz (92.443 kg)  SpO2: 98%  PainSc: 0-No pain   Current Medications:   Current Outpatient Prescriptions  Medication Sig Dispense Refill  . aspirin EC 81 MG tablet Take 81 mg by mouth daily.    . canagliflozin (INVOKANA) 300 MG TABS tablet Take 300 mg by mouth daily before breakfast.    . fluticasone (FLONASE) 50 MCG/ACT nasal spray Place 1 spray into both nostrils daily as needed for allergies or rhinitis.    . folic acid (FOLVITE) 1 MG tablet Take 1 mg by mouth daily.    . hydroxypropyl methylcellulose (ISOPTO TEARS) 2.5 % ophthalmic solution Place 1 drop into both eyes 3 (three) times daily as needed for dry eyes.    . Insulin Aspart (NOVOLOG FLEXPEN [place]) Inject 15 Units into the skin 3 (three) times daily before meals.     . insulin glargine (LANTUS) 100 UNIT/ML injection Inject 24 Units into the skin at  bedtime.     Marland Kitchen lisinopril (PRINIVIL,ZESTRIL) 10 MG tablet Take 10 mg by mouth every evening.     . meloxicam (MOBIC) 15 MG tablet Take 15 mg by mouth daily as needed for pain.    . Multiple Vitamin (MULTIVITAMIN WITH MINERALS) TABS tablet Take 1 tablet by mouth daily.    . naproxen sodium (ANAPROX) 220 MG tablet Take 220 mg by mouth as needed.    . Omega-3 Fatty Acids (FISH OIL) 500 MG CAPS Take 2 capsules by mouth daily.    . pantoprazole (PROTONIX) 40 MG tablet Take 40 mg by mouth as needed.     . Vitamin D, Cholecalciferol, 1000 UNITS CAPS Take 3 capsules by mouth daily.    . Canagliflozin (INVOKANA) 100 MG TABS Take 100 mg by mouth daily. Reported on 04/19/2015    . cetirizine (ZYRTEC) 10 MG tablet Take 10 mg by mouth as needed for allergies. Reported on 04/19/2015     No current facility-administered medications for this visit.    Functional Status:   In your present state of health, do you have any difficulty performing the following activities: 04/19/2015 01/21/2015  Hearing? N N  Vision? N N  Difficulty concentrating or making decisions? N N  Walking or climbing stairs? N N  Dressing or bathing? N N  Doing  errands, shopping? N N    Fall/Depression Screening:    PHQ 2/9 Scores 04/19/2015 01/21/2015 10/08/2014 07/20/2014  PHQ - 2 Score 0 0 0 0    Assessment:   Member seen for follow up office visit for Link to Wellness program for self management of Type 2 diabetes.  Member not meeting diabetes self management goal of 7 or below with last hemoglobin A1C of 7.9 which is improved from 8.3. Member reports limiting CHO and eating more vegetables. Reports exercising 3 times a week.  Reports no hypoglycemia since lowering Lantus to 24 units. Plan:  Plan to limit carbohydrates to 30-45GM (2-3 servings) at meal and 15GM for snacks. Plan to continue to eat at scheduled breaks and eat snacks with protein in between breaks at work and at bedtime.  Plan to eat meal with carbohydrate and  protein.   Continue to check blood sugars 3-4 times a day with goal of 80-130 before meals and less than 180 1  to 2 hours after eating.   Plan to go to gym at [facility] 2 days a week for 30-45 minutes.  Plan to continue to walk on days you do not go to gym. Plan to do [employer] daily  Plan to see Dr. Buddy Duty  07/14/15 Plan to return to Link to Wellness on  07/26/15 at [place] Problem One        Most Recent Value   Care Plan Problem One  Elevated blood sugars related to dx of Type 2 DM as evidenced by hemoglobin A1C of 7.9    Role Documenting the Problem One  Care Management [place] for Problem One  Active   THN Long Term Goal (31-90 days)  Member will see a decrease in her Hemoglobin A1C within the next 90 days   [place] Term Goal Start Date  04/19/15 [place] last Hemoglobin A1c to 7.9]   Interventions for Problem One Long Term Goal  Reviewed CHO counting and portion control, Instructed to eat small bedtime snack with protein and CHO,  Encouraged to  go to the gym 2-3 times a week and to continue to walk 3 times a week, Reviewed s/s of hypoglycemia and actions to take, Reinforced to not skip meals or snacks,  Reinforced to keep 07/14/15 appointment with Dr.Kerr    [name] RN, BSN,CCM Care Management Coordinator-Link to [place] Management [phone]

## 2015-05-12 MED FILL — LANTUS SOLOSTAR 100 UNITS/M: 100 | 62 days supply | Qty: 15 | Fill #0

## 2015-05-20 DIAGNOSIS — J029 Acute pharyngitis, unspecified: Secondary | ICD-10-CM | POA: Diagnosis not present

## 2015-05-20 MED FILL — AZITHROMYCIN 250 MG TABLET: 250 | 5 days supply | Qty: 6 | Fill #0

## 2015-06-02 DIAGNOSIS — H401122 Primary open-angle glaucoma, left eye, moderate stage: Secondary | ICD-10-CM | POA: Diagnosis not present

## 2015-06-02 DIAGNOSIS — H401111 Primary open-angle glaucoma, right eye, mild stage: Secondary | ICD-10-CM | POA: Diagnosis not present

## 2015-06-03 MED FILL — INVOKANA 300 MG TABLET: 300 | 30 days supply | Qty: 30 | Fill #5

## 2015-06-08 MED FILL — LATANOPROST 0.005% EYE DRP: 0.005 | 25 days supply | Qty: 3 | Fill #2

## 2015-07-14 ENCOUNTER — Ambulatory Visit
Admission: RE | Admit: 2015-07-14 | Discharge: 2015-07-14 | Disposition: A | Discharge status: Home / Self Care | Payer: 59 | Source: Ambulatory Visit | Attending: Internal Medicine | Admitting: Internal Medicine

## 2015-07-14 ENCOUNTER — Other Ambulatory Visit: Payer: Self-pay | Admitting: Internal Medicine

## 2015-07-14 DIAGNOSIS — M79671 Pain in right foot: Secondary | ICD-10-CM

## 2015-07-14 DIAGNOSIS — Z794 Long term (current) use of insulin: Secondary | ICD-10-CM | POA: Diagnosis not present

## 2015-07-14 DIAGNOSIS — E669 Obesity, unspecified: Secondary | ICD-10-CM | POA: Diagnosis not present

## 2015-07-14 DIAGNOSIS — E1165 Type 2 diabetes mellitus with hyperglycemia: Secondary | ICD-10-CM | POA: Diagnosis not present

## 2015-07-14 DIAGNOSIS — Z6834 Body mass index (BMI) 34.0-34.9, adult: Secondary | ICD-10-CM | POA: Diagnosis not present

## 2015-07-14 MED FILL — VICTOZA 18 MG/3 ML INJECT P: 18 | 90 days supply | Qty: 18 | Fill #0

## 2015-07-16 MED FILL — LANTUS SOLOSTAR 100 UNITS/M: 100 | 62 days supply | Qty: 15 | Fill #1

## 2015-07-16 MED FILL — NOVOLOG FLEXPEN SYRINGE: 100 | 90 days supply | Qty: 30 | Fill #0

## 2015-07-26 ENCOUNTER — Other Ambulatory Visit: Payer: Self-pay

## 2015-07-26 VITALS — BP 130/62 | HR 86 | Resp 16 | Ht 65.5 in | Wt 205.4 lb

## 2015-07-26 DIAGNOSIS — Z794 Long term (current) use of insulin: Principal | ICD-10-CM

## 2015-07-26 DIAGNOSIS — E119 Type 2 diabetes mellitus without complications: Secondary | ICD-10-CM

## 2015-07-26 NOTE — Patient Outreach (Signed)
[place] [facility]) Care Management   07/26/2015  Mary Delgado 10/23/1960 [id number]  Mary Delgado is an 55 y.o. female.   Member seen for follow up office visit for Link to Wellness program for self management of Type 2 diabetes   Subjective: States that she has started going the a gym 2 days a week for 60 minutes and she is working with a Clinical research associate.  States she is also going to walk on the treadmill at the [employer] 2 times a week also.  States she saw Dr.Kerr on 07/14/15.  States there was no changes in her medications.  States she did wake up with a low reading of 72 this AM.  States she drank some juice and at a snack.  States she thinks she did not eat enough at dinner last night as she was so tired from a stressful day at work.  States she has been having fewer low readings since she started eating a small bedtime snack.  Objective:   Review of Systems  All other systems reviewed and are negative. Reviewed glucometer 7 day average-180 14 day average-191 30 day average-191  Physical Exam Today's Vitals   07/26/15 1001  BP: 130/62  Pulse: 86  Resp: 16  Height: 1.664 m (5' 5.5")  Weight: 205 lb 6.4 oz (93.169 kg)  SpO2: 96%  PainSc: 0-No pain   Encounter Medications:   Outpatient Encounter Prescriptions as of 07/26/2015  Medication Sig Note  . aspirin EC 81 MG tablet Take 81 mg by mouth daily.   . canagliflozin (INVOKANA) 300 MG TABS tablet Take 300 mg by mouth daily before breakfast.   . cetirizine (ZYRTEC) 10 MG tablet Take 10 mg by mouth as needed for allergies. Reported on 04/19/2015   . fluticasone (FLONASE) 50 MCG/ACT nasal spray Place 1 spray into both nostrils daily as needed for allergies or rhinitis.   . folic acid (FOLVITE) 1 MG tablet Take 1 mg by mouth daily.   . hydroxypropyl methylcellulose (ISOPTO TEARS) 2.5 % ophthalmic solution Place 1 drop into both eyes 3 (three) times daily as needed for dry eyes.   . Insulin Aspart (NOVOLOG FLEXPEN [place]) Inject  18 Units into the skin 3 (three) times daily before meals.    . insulin glargine (LANTUS) 100 UNIT/ML injection Inject 24 Units into the skin at bedtime.  07/26/2015: 24 units  . lisinopril (PRINIVIL,ZESTRIL) 10 MG tablet Take 10 mg by mouth every evening.    . meloxicam (MOBIC) 15 MG tablet Take 15 mg by mouth daily as needed for pain.   . naproxen sodium (ANAPROX) 220 MG tablet Take 220 mg by mouth as needed.   . Omega-3 Fatty Acids (FISH OIL) 500 MG CAPS Take 2 capsules by mouth daily.   . pantoprazole (PROTONIX) 40 MG tablet Take 40 mg by mouth as needed.    . Canagliflozin (INVOKANA) 100 MG TABS Take 100 mg by mouth daily. Reported on 07/26/2015   . Multiple Vitamin (MULTIVITAMIN WITH MINERALS) TABS tablet Take 1 tablet by mouth daily.   . Vitamin D, Cholecalciferol, 1000 UNITS CAPS Take 2 capsules by mouth daily.     No facility-administered encounter medications on file as of 07/26/2015.    Functional Status:   In your present state of health, do you have any difficulty performing the following activities: 04/19/2015 01/21/2015  Hearing? N N  Vision? N N  Difficulty concentrating or making decisions? N N  Walking or climbing stairs? N N  Dressing  or bathing? N N  Doing errands, shopping? N N    Fall/Depression Screening:    PHQ 2/9 Scores 07/26/2015 04/19/2015 01/21/2015 10/08/2014 07/20/2014  PHQ - 2 Score 0 0 0 0 0    Assessment:  Member seen for follow up office visit for Link to Wellness program for self management of Type 2 diabetes. Member not meeting diabetes self management goal of 7 or below with last hemoglobin A1C of 8%. Member reports limiting CHO and eating more vegetables. Reports exercising 4 times a week which includes working with a trainer 2 times a week. Reports few episodes of hypoglycemia since starting to eat a bedtime snack.  Checking blood sugars 3-4 times a day.  Glucometer with time off 8 hours and time corrected for member.  Plan:   Plan to limit  carbohydrates to 30-45GM (2-3 servings) at meal and 15GM for snacks. Plan to continue to eat at scheduled breaks and eat snacks with protein in between breaks at work and at bedtime.  Plan to eat meal with carbohydrate and protein.   Plan to continue to check blood sugars 3-4 times a day with goal of 80-130 before meals and less than 180 1  to 2 hours after eating.   Plan to go to gym at [facility] 2 days a week for 30-45 minutes.  Plan to go to [employer] gym for 60 minutes twice a week  Plan to see Dr. Buddy Duty  on 10/18/15 Plan to return to Link to Wellness on  11/01/15 at 9:30AM  [place] Problem One        Most Recent Value   Care Plan Problem One  Elevated blood sugars related to dx of Type 2 DM as evidenced by hemoglobin A1C of 7.9    Role Documenting the Problem One  Care Management [place] for Problem One  Active   THN Long Term Goal (31-90 days)  Member will see a decrease in her Hemoglobin A1C within the next 90 days   THN Long Term Goal Start Date  07/26/15 [Continued last Hemoglobin A1c 8%]   Interventions for Problem One Long Term Goal  Reviewed CHO counting and portion control, Reinforced to eat small bedtime snack with protein and CHO and to be sure to eat enough at dinner time ,  Praised for  going to the gym 2 times a week with the trainer and 2 times a week at the gym at [facility], Encouraged to continue with regular exercise and how it effects glycemic control,  Reviewed s/s of hypoglycemia and actions to take, Reset time on members glucometer and instructed her how to look up the users manual online,  Reinforced to keep 10/18/15 appointment with Dr.Kerr    [name] RN, BSN,CCM Care Management Coordinator-Link to [place] Management [phone]

## 2015-07-26 NOTE — Patient Instructions (Addendum)
1. Plan to limit carbohydrates to 30-45GM (2-3 servings) at meal and 15GM for snacks. 2. Plan to continue to eat at scheduled breaks and eat snacks with protein in between breaks at work and at bedtime.  Plan to eat meal with carbohydrate and protein.   3. Plan to continue to check blood sugars 3-4 times a day with goal of 80-130 before meals and less than 180 1  to 2 hours after eating.   4. Plan to go to gym at [facility] 2 days a week for 30-45 minutes.  Plan to go to [employer] gym for 60 minutes twice a week  5. Plan to see Dr. Buddy Duty  on 10/18/15 6. Plan to return to Link to Wellness on  11/01/15 at 9:30AM

## 2015-08-31 DIAGNOSIS — M2550 Pain in unspecified joint: Secondary | ICD-10-CM | POA: Diagnosis not present

## 2015-09-06 MED FILL — ACCU-CHEK SMARTVIEW TEST ST: 90 days supply | Qty: 400 | Fill #2

## 2015-09-06 MED FILL — INVOKANA 300 MG TABLET: 300 | 30 days supply | Qty: 30 | Fill #0

## 2015-09-06 MED FILL — LANTUS SOLOSTAR 100 UNITS/M: 100 | 62 days supply | Qty: 15 | Fill #2

## 2015-11-01 ENCOUNTER — Ambulatory Visit: Payer: 59

## 2015-11-01 MED FILL — LATANOPROST 0.005% EYE DRP: 0.005 | 25 days supply | Qty: 3 | Fill #3

## 2015-11-15 MED FILL — NOVOLOG FLEXPEN SYRINGE: 100 | 90 days supply | Qty: 30 | Fill #1

## 2015-11-15 MED FILL — LANTUS SOLOSTAR 100 UNITS/M: 100 | 62 days supply | Qty: 15 | Fill #3

## 2015-11-19 DIAGNOSIS — L989 Disorder of the skin and subcutaneous tissue, unspecified: Secondary | ICD-10-CM | POA: Diagnosis not present

## 2015-11-19 DIAGNOSIS — M79671 Pain in right foot: Secondary | ICD-10-CM | POA: Diagnosis not present

## 2015-11-19 DIAGNOSIS — Z6835 Body mass index (BMI) 35.0-35.9, adult: Secondary | ICD-10-CM | POA: Diagnosis not present

## 2015-11-19 DIAGNOSIS — Z794 Long term (current) use of insulin: Secondary | ICD-10-CM | POA: Diagnosis not present

## 2015-11-19 DIAGNOSIS — E1165 Type 2 diabetes mellitus with hyperglycemia: Secondary | ICD-10-CM | POA: Diagnosis not present

## 2015-11-19 DIAGNOSIS — E669 Obesity, unspecified: Secondary | ICD-10-CM | POA: Diagnosis not present

## 2015-11-24 ENCOUNTER — Ambulatory Visit: Payer: 59 | Admitting: Podiatry

## 2015-11-26 ENCOUNTER — Ambulatory Visit (INDEPENDENT_AMBULATORY_CARE_PROVIDER_SITE_OTHER): Payer: 59

## 2015-11-26 ENCOUNTER — Ambulatory Visit (INDEPENDENT_AMBULATORY_CARE_PROVIDER_SITE_OTHER): Payer: 59 | Admitting: Podiatry

## 2015-11-26 ENCOUNTER — Encounter: Payer: Self-pay | Admitting: Podiatry

## 2015-11-26 VITALS — BP 111/67 | HR 80 | Resp 16

## 2015-11-26 DIAGNOSIS — M7661 Achilles tendinitis, right leg: Secondary | ICD-10-CM | POA: Diagnosis not present

## 2015-11-26 DIAGNOSIS — M79671 Pain in right foot: Secondary | ICD-10-CM

## 2015-11-26 MED ORDER — TRIAMCINOLONE ACETONIDE 10 MG/ML IJ SUSP
10.0000 mg | Freq: Once | INTRAMUSCULAR | Status: AC
  Administered 2015-11-26: 10 mg

## 2015-11-26 NOTE — Patient Instructions (Signed)

## 2015-11-26 NOTE — Progress Notes (Signed)
Subjective:     Patient ID: Mary Delgado, female   DOB: 02-22-1961, 55 y.o.   MRN: [id number]  HPI patient presents stating he's developed a lot of discomfort in the back of his right heel that's worse when he tries to get up in the morning and seems to be stiff as the day goes on   Review of Systems     Objective:   Physical Exam Neurovascular status is unchanged negative Homans sign noted with pain in the posterior lateral aspect of the right heel at the insertion of the Achilles tendon. The Achilles tendon is functioning well and muscle strength is adequate    Assessment:     Achilles tendinitis right lateral side    Plan:     H&P x-rays reviewed and careful injection administered lateral 3 mg Dexon some Kenalog 5 mill grams Xylocaine after first explaining chances for rupture. Patient had padding applied and we'll begin ice and stretches and will be seen back to recheck  X-ray report indicate small spur with no indications of stress fracture arthritis

## 2015-12-09 ENCOUNTER — Ambulatory Visit: Payer: 59 | Admitting: Podiatry

## 2015-12-16 ENCOUNTER — Ambulatory Visit: Payer: Self-pay

## 2015-12-16 DIAGNOSIS — R61 Generalized hyperhidrosis: Secondary | ICD-10-CM | POA: Diagnosis not present

## 2015-12-16 DIAGNOSIS — D485 Neoplasm of uncertain behavior of skin: Secondary | ICD-10-CM | POA: Diagnosis not present

## 2015-12-16 DIAGNOSIS — B360 Pityriasis versicolor: Secondary | ICD-10-CM | POA: Diagnosis not present

## 2015-12-17 ENCOUNTER — Encounter: Payer: Self-pay | Admitting: Podiatry

## 2015-12-17 ENCOUNTER — Ambulatory Visit (INDEPENDENT_AMBULATORY_CARE_PROVIDER_SITE_OTHER): Payer: 59 | Admitting: Podiatry

## 2015-12-17 DIAGNOSIS — M7661 Achilles tendinitis, right leg: Secondary | ICD-10-CM | POA: Diagnosis not present

## 2015-12-17 MED FILL — CICLOPIROX 0.77% CREAM: 0.77 | 30 days supply | Qty: 90 | Fill #0

## 2015-12-19 NOTE — Progress Notes (Signed)
Subjective:     Patient ID: Mary Delgado, female   DOB: [birth year], 55 y.o.   MRN: [id number]  HPI patient states she's feeling much better with her right foot with significant diminishment of discomfort and pain still present upon deep palpation but quite a bit better   Review of Systems     Objective:   Physical Exam Neurovascular status intact negative Homans sign was noted with improvement of fascially-like symptoms plantar aspect right with no pain upon deep palpation    Assessment:     Improving fasciitis-like symptoms    Plan:     Reviewed continued physical therapy anti-inflammatory shoe gear modification and will be seen back as needed for possible orthotic therapy

## 2015-12-20 ENCOUNTER — Ambulatory Visit: Payer: Self-pay

## 2015-12-21 MED FILL — LISINOPRIL 10 MG TABLET: 10 | 90 days supply | Qty: 90 | Fill #0

## 2015-12-21 MED FILL — ACCU-CHEK SMARTVIEW TEST ST: 90 days supply | Qty: 400 | Fill #0

## 2015-12-22 MED FILL — LATANOPROST 0.005% EYE DRP: 0.005 | 25 days supply | Qty: 3 | Fill #0

## 2016-01-10 ENCOUNTER — Other Ambulatory Visit: Payer: Self-pay

## 2016-01-10 DIAGNOSIS — Z794 Long term (current) use of insulin: Principal | ICD-10-CM

## 2016-01-10 DIAGNOSIS — E119 Type 2 diabetes mellitus without complications: Secondary | ICD-10-CM

## 2016-01-10 NOTE — Patient Instructions (Signed)
1. Plan to limit carbohydrates to 30-45GM (2-3 servings) at meal and 15GM for snacks. 2. Plan to continue to eat at scheduled breaks and eat snacks with protein in between breaks at work and at bedtime.  Plan to eat meal with carbohydrate and protein.   3. Plan to continue to check blood sugars 3-4 times a day with goal of 80-130 before meals and less than 180 1  to 2 hours after eating.   4. Plan to go to gym at [facility] 2 days a week for 30-45 minutes.  Plan to continue to walk for 30 minutes 3-4 times a week 5. Plan to see Dr. Buddy Duty  on 01/31/16 6. Plan to enroll in [facility] program 7. Plan to return to [facility] on  04/10/16 at 9:00AM

## 2016-01-10 NOTE — Patient Outreach (Signed)
[facility]) Care Management   01/10/2016  Dalal Hatem [birth year] [id number]  Mary Delgado is an 55 y.o. female.   Member seen for follow up office visit for [facility] program for self management of Type 2 diabetes  Subjective: Member states that her hemoglobin A1C had gone up to 8.9% when she saw Dr. Buddy Duty in August.  States that he adjusted her insulin some.  States that her blood sugars had been up to over 300 after meals but they are getting better now.   States she does not know why they were higher other than she ate cake one day.  States she did get a cortisone shot in her Rt ankle and then her sugars went up.  States she has stopped eating rice and she is trying to watch her CHO portions.  States she is walking on the days she is off and she tries to go the the gym to work on the machines after work if she is not too tired.  Objective:   ROS  Physical Exam Today's Vitals   01/10/16 0838 01/10/16 0852  BP: 118/76   Pulse: 82   Resp: 14   SpO2: 96%   Weight: 209 lb 9.6 oz (95.1 kg)   Height: 1.664 m (5' 5.5")   PainSc: 0-No pain 0-No pain   Encounter Medications:   Outpatient Encounter Prescriptions as of 01/10/2016  Medication Sig Note  . aspirin EC 81 MG tablet Take 81 mg by mouth daily.   . canagliflozin (INVOKANA) 300 MG TABS tablet Take 300 mg by mouth daily before breakfast.   . cetirizine (ZYRTEC) 10 MG tablet Take 10 mg by mouth as needed for allergies. Reported on 04/19/2015   . fluticasone (FLONASE) 50 MCG/ACT nasal spray Place 1 spray into both nostrils daily as needed for allergies or rhinitis.   . folic acid (FOLVITE) 1 MG tablet Take 1 mg by mouth daily.   . hydroxypropyl methylcellulose (ISOPTO TEARS) 2.5 % ophthalmic solution Place 1 drop into both eyes 3 (three) times daily as needed for dry eyes.   . Insulin Aspart (NOVOLOG FLEXPEN [place]) Inject 20 Units into the skin 3 (three) times daily before meals.    . insulin glargine (LANTUS) 100  UNIT/ML injection Inject 20 Units into the skin at bedtime.  01/10/2016: 20 units  . lisinopril (PRINIVIL,ZESTRIL) 10 MG tablet Take 10 mg by mouth every evening.    . meloxicam (MOBIC) 15 MG tablet Take 15 mg by mouth daily as needed for pain.   . Multiple Vitamin (MULTIVITAMIN WITH MINERALS) TABS tablet Take 1 tablet by mouth daily.   . naproxen sodium (ANAPROX) 220 MG tablet Take 220 mg by mouth as needed.   . Omega-3 Fatty Acids (FISH OIL) 500 MG CAPS Take 2 capsules by mouth daily.   . pantoprazole (PROTONIX) 40 MG tablet Take 40 mg by mouth as needed.    . Vitamin D, Cholecalciferol, 1000 UNITS CAPS Take 2 capsules by mouth daily.     No facility-administered encounter medications on file as of 01/10/2016.     Functional Status:   In your present state of health, do you have any difficulty performing the following activities: 01/10/2016 04/19/2015  Hearing? N N  Vision? N N  Difficulty concentrating or making decisions? N N  Walking or climbing stairs? N N  Dressing or bathing? N N  Doing errands, shopping? N N  Some recent data might be hidden    Fall/Depression Screening:  PHQ 2/9 Scores 01/10/2016 07/26/2015 04/19/2015 01/21/2015 10/08/2014 07/20/2014  PHQ - 2 Score 0 0 0 0 0 0    Assessment:  Member seen for follow up office visit for [facility] program for self management of Type 2 diabetes. Member not meeting diabetes self management goal of 7% or below with last hemoglobin A1C increased to  8.9%. Member had steroid injection in September and had higher CBGs after the injection.   Member reports limiting CHO and eating more vegetables. Reports exercising 2-3 times a week. Checking blood sugars 3-4 times a day.  Member to see endocrinologist 01/31/16.  Plan:  Plan to limit carbohydrates to 30-45GM (2-3 servings) at meal and 15GM for snacks. Plan to continue to eat at scheduled breaks and eat snacks with protein in between breaks at work and at bedtime.  Plan to eat meal  with carbohydrate and protein.   Plan to continue to check blood sugars 3-4 times a day with goal of 80-130 before meals and less than 180 1  to 2 hours after eating.   Plan to go to gym at [facility] 2 days a week for 30-45 minutes.  Plan to continue to walk for 30 minutes 3-4 times a week Plan to see Dr. Buddy Duty  on 01/31/16 Plan to enroll in [facility] program Plan to return to [place] [facility] on  04/10/16 at [place] Problem One   Flowsheet Row Most Recent Value  Care Plan Problem One  Elevated blood sugars related to dx of Type 2 DM as evidenced by hemoglobin A1C of 8.9%   Role Documenting the Problem One  Care Management [place] for Problem One  Active  [facility] Long Term Goal (31-90 days)  Member will see a decrease in her Hemoglobin A1C within the next 90 days  [place] Term Goal Start Date  01/10/16 [Continued last Hemoglobin A1C increased to 8.9%]  Interventions for Problem One Long Term Goal  Reviewed CHO counting and portion control, Reinforced to eat small bedtime snack with protein and CHO and to be sure to eat enough at dinner time ,  Reinforced to continue to go 2 times a week at the gym at [facility], Encouraged to continue with regular exercise and how it effects glycemic control, Instructed how a cortisone injection can make her blood sugars go up, Given handout on the [facility] program and encouraged to enroll, Given [facility] calendar,  Reinforced to keep 01/31/16 appointment with Dr.Kerr    [name] RN, BSN,CCM Care Management Coordinator-Link to [place] Management [phone]

## 2016-01-31 DIAGNOSIS — E669 Obesity, unspecified: Secondary | ICD-10-CM | POA: Diagnosis not present

## 2016-01-31 DIAGNOSIS — Z794 Long term (current) use of insulin: Secondary | ICD-10-CM | POA: Diagnosis not present

## 2016-01-31 DIAGNOSIS — E1165 Type 2 diabetes mellitus with hyperglycemia: Secondary | ICD-10-CM | POA: Diagnosis not present

## 2016-01-31 DIAGNOSIS — Z6836 Body mass index (BMI) 36.0-36.9, adult: Secondary | ICD-10-CM | POA: Diagnosis not present

## 2016-01-31 DIAGNOSIS — M79671 Pain in right foot: Secondary | ICD-10-CM | POA: Diagnosis not present

## 2016-02-01 MED FILL — LANTUS SOLOSTAR 100 UNITS/M: 100 | 62 days supply | Qty: 15 | Fill #4

## 2016-02-01 MED FILL — NOVOLOG FLEXPEN SYRINGE: 100 | 83 days supply | Qty: 60 | Fill #0

## 2016-02-04 DIAGNOSIS — E113291 Type 2 diabetes mellitus with mild nonproliferative diabetic retinopathy without macular edema, right eye: Secondary | ICD-10-CM | POA: Diagnosis not present

## 2016-02-04 DIAGNOSIS — H401122 Primary open-angle glaucoma, left eye, moderate stage: Secondary | ICD-10-CM | POA: Diagnosis not present

## 2016-02-04 DIAGNOSIS — H524 Presbyopia: Secondary | ICD-10-CM | POA: Diagnosis not present

## 2016-02-04 DIAGNOSIS — H401111 Primary open-angle glaucoma, right eye, mild stage: Secondary | ICD-10-CM | POA: Diagnosis not present

## 2016-03-17 DIAGNOSIS — E1165 Type 2 diabetes mellitus with hyperglycemia: Secondary | ICD-10-CM | POA: Diagnosis not present

## 2016-03-17 DIAGNOSIS — Z794 Long term (current) use of insulin: Secondary | ICD-10-CM | POA: Diagnosis not present

## 2016-03-17 DIAGNOSIS — Z79899 Other long term (current) drug therapy: Secondary | ICD-10-CM | POA: Diagnosis not present

## 2016-03-17 DIAGNOSIS — E669 Obesity, unspecified: Secondary | ICD-10-CM | POA: Diagnosis not present

## 2016-03-17 DIAGNOSIS — Z6836 Body mass index (BMI) 36.0-36.9, adult: Secondary | ICD-10-CM | POA: Diagnosis not present

## 2016-03-17 DIAGNOSIS — Z0001 Encounter for general adult medical examination with abnormal findings: Secondary | ICD-10-CM | POA: Diagnosis not present

## 2016-03-17 DIAGNOSIS — E559 Vitamin D deficiency, unspecified: Secondary | ICD-10-CM | POA: Diagnosis not present

## 2016-03-17 DIAGNOSIS — I10 Essential (primary) hypertension: Secondary | ICD-10-CM | POA: Diagnosis not present

## 2016-04-04 MED FILL — LATANOPROST 0.005% EYE DRP: 0.005 | 25 days supply | Qty: 3 | Fill #1

## 2016-04-04 MED FILL — LANTUS SOLOSTAR 100 UNITS/M: 100 | 62 days supply | Qty: 15 | Fill #5

## 2016-04-04 MED FILL — ACCU-CHEK SMARTVIEW STRIP: 11 days supply | Qty: 50 | Fill #1

## 2016-04-10 ENCOUNTER — Ambulatory Visit: Payer: Self-pay

## 2016-05-03 DIAGNOSIS — Z0001 Encounter for general adult medical examination with abnormal findings: Secondary | ICD-10-CM | POA: Diagnosis not present

## 2016-05-03 DIAGNOSIS — Z1159 Encounter for screening for other viral diseases: Secondary | ICD-10-CM | POA: Diagnosis not present

## 2016-05-17 ENCOUNTER — Ambulatory Visit
Admission: RE | Admit: 2016-05-17 | Discharge: 2016-05-17 | Disposition: A | Discharge status: Home / Self Care | Payer: 59 | Source: Ambulatory Visit | Attending: Internal Medicine | Admitting: Internal Medicine

## 2016-05-17 ENCOUNTER — Other Ambulatory Visit: Payer: Self-pay | Admitting: Internal Medicine

## 2016-05-17 DIAGNOSIS — M25532 Pain in left wrist: Secondary | ICD-10-CM

## 2016-05-17 DIAGNOSIS — E669 Obesity, unspecified: Secondary | ICD-10-CM | POA: Diagnosis not present

## 2016-05-17 DIAGNOSIS — Z6835 Body mass index (BMI) 35.0-35.9, adult: Secondary | ICD-10-CM | POA: Diagnosis not present

## 2016-05-17 DIAGNOSIS — E1165 Type 2 diabetes mellitus with hyperglycemia: Secondary | ICD-10-CM | POA: Diagnosis not present

## 2016-05-17 DIAGNOSIS — Z794 Long term (current) use of insulin: Secondary | ICD-10-CM | POA: Diagnosis not present

## 2016-05-17 MED FILL — LISINOPRIL 10 MG TABLET: 10 | 90 days supply | Qty: 90 | Fill #0 | Status: TO

## 2016-05-17 MED FILL — ACCU-CHEK GUIDE TEST STRIP: 90 days supply | Qty: 300 | Fill #0

## 2016-07-18 MED FILL — LANTUS SOLOSTAR 100 UNITS/M: 100 | 75 days supply | Qty: 15 | Fill #0

## 2016-07-19 MED FILL — HUMALOG 100 UNITS/ML KWIKPE: 100 | 41 days supply | Qty: 30 | Fill #0 | Status: TO

## 2016-07-21 MED FILL — LATANOPROST 0.005% EYE DRP: 0.005 | 25 days supply | Qty: 3 | Fill #2

## 2016-08-29 DIAGNOSIS — M25532 Pain in left wrist: Secondary | ICD-10-CM | POA: Diagnosis not present

## 2016-08-30 MED FILL — ACCU-CHEK GUIDE TEST STRIP: 90 days supply | Qty: 300 | Fill #1

## 2016-09-05 DIAGNOSIS — E669 Obesity, unspecified: Secondary | ICD-10-CM | POA: Diagnosis not present

## 2016-09-05 DIAGNOSIS — Z6835 Body mass index (BMI) 35.0-35.9, adult: Secondary | ICD-10-CM | POA: Diagnosis not present

## 2016-09-05 DIAGNOSIS — E1165 Type 2 diabetes mellitus with hyperglycemia: Secondary | ICD-10-CM | POA: Diagnosis not present

## 2016-09-05 DIAGNOSIS — Z794 Long term (current) use of insulin: Secondary | ICD-10-CM | POA: Diagnosis not present

## 2016-09-08 DIAGNOSIS — M654 Radial styloid tenosynovitis [de Quervain]: Secondary | ICD-10-CM | POA: Diagnosis not present

## 2016-09-08 MED FILL — LISINOPRIL 10 MG TABLET: 10 | 90 days supply | Qty: 90 | Fill #0

## 2016-09-08 MED FILL — HUMALOG 100 UNITS/ML KWIKPE: 100 | 41 days supply | Qty: 30 | Fill #0 | Status: TO

## 2016-12-06 MED FILL — LATANOPROST 0.005% EYE DRP: 0.005 | 25 days supply | Qty: 3 | Fill #3

## 2016-12-06 MED FILL — HUMALOG 100 UNITS/ML KWIKPE: 100 | 41 days supply | Qty: 30 | Fill #0

## 2016-12-06 MED FILL — LANTUS SOLOSTAR 100 UNITS/M: 100 | 90 days supply | Qty: 30 | Fill #0

## 2016-12-15 DIAGNOSIS — Z6835 Body mass index (BMI) 35.0-35.9, adult: Secondary | ICD-10-CM | POA: Diagnosis not present

## 2016-12-15 DIAGNOSIS — Z794 Long term (current) use of insulin: Secondary | ICD-10-CM | POA: Diagnosis not present

## 2016-12-15 DIAGNOSIS — E669 Obesity, unspecified: Secondary | ICD-10-CM | POA: Diagnosis not present

## 2016-12-15 DIAGNOSIS — E1165 Type 2 diabetes mellitus with hyperglycemia: Secondary | ICD-10-CM | POA: Diagnosis not present

## 2017-02-01 MED FILL — UNIFINE PENTIPS 8MM 31G: 31G X 8 MM | 50 days supply | Qty: 100 | Fill #0

## 2017-02-02 MED FILL — LATANOPROST 0.005% EYE DRP: 0.005 | 25 days supply | Qty: 3 | Fill #0

## 2017-02-05 MED FILL — ACCU-CHEK GUIDE TEST STRIP: 90 days supply | Qty: 300 | Fill #2

## 2017-02-16 ENCOUNTER — Other Ambulatory Visit: Payer: Self-pay

## 2017-02-16 NOTE — Patient Outreach (Signed)
[facility]) Care Management  02/16/2017  Kseniya Grunden [birth year] [id number]   Case closed in [place].  Member is being followed by the [employer] program Member enrolled in an external program. [name] RN, [facility] Care Management Coordinator-[facility] to [place] Management [phone]

## 2017-03-08 MED FILL — HUMALOG 100 UNITS/ML KWIKPE: 100 | 28 days supply | Qty: 24 | Fill #0

## 2017-04-19 DIAGNOSIS — Z6835 Body mass index (BMI) 35.0-35.9, adult: Secondary | ICD-10-CM | POA: Diagnosis not present

## 2017-04-19 DIAGNOSIS — E669 Obesity, unspecified: Secondary | ICD-10-CM | POA: Diagnosis not present

## 2017-04-19 DIAGNOSIS — Z794 Long term (current) use of insulin: Secondary | ICD-10-CM | POA: Diagnosis not present

## 2017-04-19 DIAGNOSIS — E1165 Type 2 diabetes mellitus with hyperglycemia: Secondary | ICD-10-CM | POA: Diagnosis not present

## 2017-04-19 MED FILL — ATORVASTATIN 10 MG TABLET: 10 | 90 days supply | Qty: 90 | Fill #0

## 2017-04-24 ENCOUNTER — Other Ambulatory Visit (HOSPITAL_COMMUNITY)
Admission: RE | Admit: 2017-04-24 | Discharge: 2017-04-24 | Disposition: A | Discharge status: Home / Self Care | Payer: 59 | Source: Ambulatory Visit | Attending: Family Medicine | Admitting: Family Medicine

## 2017-04-24 ENCOUNTER — Other Ambulatory Visit: Payer: Self-pay | Admitting: Family Medicine

## 2017-04-24 DIAGNOSIS — E559 Vitamin D deficiency, unspecified: Secondary | ICD-10-CM | POA: Diagnosis not present

## 2017-04-24 DIAGNOSIS — Z01411 Encounter for gynecological examination (general) (routine) with abnormal findings: Secondary | ICD-10-CM | POA: Diagnosis not present

## 2017-04-24 DIAGNOSIS — E1165 Type 2 diabetes mellitus with hyperglycemia: Secondary | ICD-10-CM | POA: Diagnosis not present

## 2017-04-24 DIAGNOSIS — Z794 Long term (current) use of insulin: Secondary | ICD-10-CM | POA: Diagnosis not present

## 2017-04-24 DIAGNOSIS — Z79899 Other long term (current) drug therapy: Secondary | ICD-10-CM | POA: Diagnosis not present

## 2017-04-24 DIAGNOSIS — Z0001 Encounter for general adult medical examination with abnormal findings: Secondary | ICD-10-CM | POA: Diagnosis not present

## 2017-04-24 DIAGNOSIS — J309 Allergic rhinitis, unspecified: Secondary | ICD-10-CM | POA: Diagnosis not present

## 2017-04-24 DIAGNOSIS — Z6835 Body mass index (BMI) 35.0-35.9, adult: Secondary | ICD-10-CM | POA: Diagnosis not present

## 2017-04-24 DIAGNOSIS — I10 Essential (primary) hypertension: Secondary | ICD-10-CM | POA: Diagnosis not present

## 2017-04-24 MED FILL — LISINOPRIL 10 MG TABS: 10 | 90 days supply | Qty: 90 | Fill #0

## 2017-04-24 MED FILL — LANTUS SOLOSTAR 100 UNITS/M: 100 | 90 days supply | Qty: 30 | Fill #1

## 2017-04-24 MED FILL — HUMALOG 100 UNITS/ML KWIKPE: 100 | 28 days supply | Qty: 24 | Fill #1

## 2017-04-25 LAB — CYTOLOGY - PAP
Diagnosis: NEGATIVE
HPV: NOT DETECTED

## 2017-04-27 MED FILL — AZELASTINE 0.1% (137 MCG) S: 0.1 | 30 days supply | Qty: 30 | Fill #0

## 2017-05-01 MED FILL — LATANOPROST 0.005% EYE DRP: 0.005 | 25 days supply | Qty: 3 | Fill #1

## 2017-05-15 DIAGNOSIS — H401122 Primary open-angle glaucoma, left eye, moderate stage: Secondary | ICD-10-CM | POA: Diagnosis not present

## 2017-05-15 DIAGNOSIS — H5213 Myopia, bilateral: Secondary | ICD-10-CM | POA: Diagnosis not present

## 2017-05-15 DIAGNOSIS — H524 Presbyopia: Secondary | ICD-10-CM | POA: Diagnosis not present

## 2017-05-15 DIAGNOSIS — H52203 Unspecified astigmatism, bilateral: Secondary | ICD-10-CM | POA: Diagnosis not present

## 2017-06-12 MED FILL — ACCU-CHEK GUIDE TEST STRIP: 90 days supply | Qty: 300 | Fill #0

## 2017-06-12 MED FILL — HUMALOG 100 UNITS/ML KWIKPE: 100 | 28 days supply | Qty: 24 | Fill #2

## 2017-07-30 MED FILL — HUMALOG 100 UNITS/ML KWIKPE: 100 | 28 days supply | Qty: 24 | Fill #3

## 2017-08-16 DIAGNOSIS — M79673 Pain in unspecified foot: Secondary | ICD-10-CM | POA: Diagnosis not present

## 2017-08-16 DIAGNOSIS — M79672 Pain in left foot: Secondary | ICD-10-CM | POA: Diagnosis not present

## 2017-08-17 ENCOUNTER — Other Ambulatory Visit: Payer: Self-pay | Admitting: Family Medicine

## 2017-08-17 ENCOUNTER — Ambulatory Visit
Admission: RE | Admit: 2017-08-17 | Discharge: 2017-08-17 | Disposition: A | Discharge status: Home / Self Care | Payer: 59 | Source: Ambulatory Visit | Attending: Family Medicine | Admitting: Family Medicine

## 2017-08-17 DIAGNOSIS — R52 Pain, unspecified: Secondary | ICD-10-CM

## 2017-08-17 DIAGNOSIS — M79671 Pain in right foot: Secondary | ICD-10-CM | POA: Diagnosis not present

## 2017-08-17 DIAGNOSIS — R609 Edema, unspecified: Secondary | ICD-10-CM

## 2017-08-30 DIAGNOSIS — E669 Obesity, unspecified: Secondary | ICD-10-CM | POA: Diagnosis not present

## 2017-08-30 DIAGNOSIS — E1165 Type 2 diabetes mellitus with hyperglycemia: Secondary | ICD-10-CM | POA: Diagnosis not present

## 2017-08-30 DIAGNOSIS — Z794 Long term (current) use of insulin: Secondary | ICD-10-CM | POA: Diagnosis not present

## 2017-09-03 ENCOUNTER — Ambulatory Visit: Payer: 59 | Admitting: Podiatry

## 2017-09-03 ENCOUNTER — Ambulatory Visit (INDEPENDENT_AMBULATORY_CARE_PROVIDER_SITE_OTHER): Payer: 59

## 2017-09-03 ENCOUNTER — Other Ambulatory Visit: Payer: Self-pay | Admitting: Podiatry

## 2017-09-03 ENCOUNTER — Encounter: Payer: Self-pay | Admitting: Podiatry

## 2017-09-03 DIAGNOSIS — M79672 Pain in left foot: Secondary | ICD-10-CM

## 2017-09-03 DIAGNOSIS — M722 Plantar fascial fibromatosis: Secondary | ICD-10-CM

## 2017-09-03 DIAGNOSIS — M79671 Pain in right foot: Secondary | ICD-10-CM

## 2017-09-03 MED ORDER — DICLOFENAC SODIUM 75 MG PO TBEC: 75.0000 mg | DELAYED_RELEASE_TABLET | Freq: Two times a day (BID) | ORAL | 2 refills | Status: AC

## 2017-09-03 MED ORDER — TRIAMCINOLONE ACETONIDE 10 MG/ML IJ SUSP
10.0000 mg | Freq: Once | INTRAMUSCULAR | Status: AC
  Administered 2017-09-03: 10 mg

## 2017-09-03 MED FILL — DICLOFENAC SOD EC 75 MG TAB: 75 | 25 days supply | Qty: 50 | Fill #0

## 2017-09-03 NOTE — Patient Instructions (Signed)

## 2017-09-03 NOTE — Progress Notes (Signed)
Subjective:   Patient ID: Mary Delgado, female   DOB: 57 y.o.   MRN: [id number]   HPI Patient presents with a lot of discomfort in the plantar aspect of the heel region left over right with inflammation fluid around the medial band.  States is been present for several months and she is on 12-hour shifts currently.  Patient does not smoke likes to be active   Review of Systems  All other systems reviewed and are negative.       Objective:  Physical Exam  Constitutional: She appears well-developed and well-nourished.  Cardiovascular: Intact distal pulses.  Pulmonary/Chest: Effort normal.  Musculoskeletal: Normal range of motion.  Neurological: She is alert.  Skin: Skin is warm.  Nursing note and vitals reviewed.   Neurovascular status found to be intact muscle strength is adequate range of motion within normal limits with patient found to have acute inflammation of the plantar fascial left at the insertional point of the tendon the calcaneus with inflammation fluid around the medial band.  Patient is noted to have good digital perfusion well oriented x3     Assessment:  Acute plantar fascial symptomatology left over right heel     Plan:  H&P conditions reviewed and injected the left plantar fascia 3 mg Kenalog 5 mg Xylocaine and applied fascial brace with instructions on usage left.  Patient was instructed on physical therapy will be seen back to recheck.  Also placed patient on diclofenac as she is not taking Mobic currently  X-ray indicates spur formation heels with moderate depression of the arch and no indication of stress fracture

## 2017-09-10 MED FILL — LANTUS SOLOSTAR 100 UNITS/M: 100 | 80 days supply | Qty: 21 | Fill #0

## 2017-09-10 MED FILL — UNIFINE PENTIPS 31GX3/16": 31G X 5 MM | 50 days supply | Qty: 100 | Fill #0

## 2017-09-10 MED FILL — UNIFINE PENTIPS 31GX3/16: 31G X 5 MM | 50 days supply | Qty: 100 | Fill #0

## 2017-09-10 MED FILL — HUMALOG 100 UNITS/ML KWIKPE: 100 | 28 days supply | Qty: 24 | Fill #4

## 2017-09-17 ENCOUNTER — Encounter: Payer: Self-pay | Admitting: Podiatry

## 2017-09-17 ENCOUNTER — Other Ambulatory Visit: Payer: Self-pay

## 2017-09-17 ENCOUNTER — Ambulatory Visit: Payer: 59 | Admitting: Podiatry

## 2017-09-17 DIAGNOSIS — M722 Plantar fascial fibromatosis: Secondary | ICD-10-CM

## 2017-09-17 NOTE — Progress Notes (Signed)
Subjective:   Patient ID: Mary Delgado, female   DOB: 57 y.o.   MRN: [id number]   HPI Patient states she is improving a lot with minimal discomfort upon palpation to the plantar heel   ROS      Objective:  Physical Exam  Neurovascular status intact with discomfort of the plantar heel that improved quite a bit with pain still upon deep palpation but significantly better     Assessment:  Improvement of plantar fasciitis this very satisfactory at the time with currently minimal Achilles tendinitis     Plan:  H&P condition reviewed and recommended continued supportive shoe gear usage anti-inflammatories and brace usage.  Patient's discharge will be seen back as needed

## 2017-10-15 DIAGNOSIS — H401111 Primary open-angle glaucoma, right eye, mild stage: Secondary | ICD-10-CM | POA: Diagnosis not present

## 2017-10-15 DIAGNOSIS — R51 Headache: Secondary | ICD-10-CM | POA: Diagnosis not present

## 2017-10-15 DIAGNOSIS — H401122 Primary open-angle glaucoma, left eye, moderate stage: Secondary | ICD-10-CM | POA: Diagnosis not present

## 2017-10-19 DIAGNOSIS — K219 Gastro-esophageal reflux disease without esophagitis: Secondary | ICD-10-CM | POA: Diagnosis not present

## 2017-10-19 DIAGNOSIS — M545 Low back pain: Secondary | ICD-10-CM | POA: Diagnosis not present

## 2017-10-19 DIAGNOSIS — M722 Plantar fascial fibromatosis: Secondary | ICD-10-CM | POA: Diagnosis not present

## 2017-10-19 DIAGNOSIS — R51 Headache: Secondary | ICD-10-CM | POA: Diagnosis not present

## 2017-10-22 MED FILL — ACCU-CHEK GUIDE TEST STRIP: 90 days supply | Qty: 300 | Fill #1

## 2017-10-22 MED FILL — LATANOPROST 0.005% EYE DRP: 0.005 | 25 days supply | Qty: 3 | Fill #2

## 2017-11-07 MED FILL — HUMALOG 100 UNITS/ML KWIKPE: 100 | 28 days supply | Qty: 24 | Fill #5

## 2017-12-24 MED FILL — LANTUS SOLOSTAR 100 UNITS/M: 100 | 80 days supply | Qty: 21 | Fill #1

## 2017-12-24 MED FILL — LATANOPROST 0.005% EYE DRP: 0.005 | 25 days supply | Qty: 3 | Fill #3

## 2017-12-25 MED FILL — HUMALOG 100 UNITS/ML KWIKPE: 100 | 89 days supply | Qty: 75 | Fill #0

## 2018-01-14 DIAGNOSIS — E1165 Type 2 diabetes mellitus with hyperglycemia: Secondary | ICD-10-CM | POA: Diagnosis not present

## 2018-01-14 DIAGNOSIS — Z794 Long term (current) use of insulin: Secondary | ICD-10-CM | POA: Diagnosis not present

## 2018-01-14 DIAGNOSIS — E669 Obesity, unspecified: Secondary | ICD-10-CM | POA: Diagnosis not present

## 2018-01-14 MED FILL — NOVOLOG FLEXPEN SYRINGE: 100 | 14 days supply | Qty: 12 | Fill #0

## 2018-02-13 DIAGNOSIS — J019 Acute sinusitis, unspecified: Secondary | ICD-10-CM | POA: Diagnosis not present

## 2018-02-13 DIAGNOSIS — K219 Gastro-esophageal reflux disease without esophagitis: Secondary | ICD-10-CM | POA: Diagnosis not present

## 2018-02-13 MED FILL — PANTOPRAZOLE SOD DR 40 MG T: 40 | 90 days supply | Qty: 90 | Fill #0

## 2018-02-13 MED FILL — AMOX-CLAV 875-125 MG TABLET: 875-125 | 10 days supply | Qty: 20 | Fill #0

## 2018-02-13 MED FILL — AZELASTINE HCL 137 MCG/SPRA: 137 | 50 days supply | Qty: 30 | Fill #0

## 2018-03-25 MED FILL — LANTUS SOLOSTAR 100 UNITS/M: 100 | 80 days supply | Qty: 21 | Fill #2

## 2018-03-26 MED FILL — ACCU-CHEK GUIDE TEST STRIP: 90 days supply | Qty: 300 | Fill #2

## 2018-04-18 DIAGNOSIS — E1165 Type 2 diabetes mellitus with hyperglycemia: Secondary | ICD-10-CM | POA: Diagnosis not present

## 2018-04-18 DIAGNOSIS — Z794 Long term (current) use of insulin: Secondary | ICD-10-CM | POA: Diagnosis not present

## 2018-04-18 DIAGNOSIS — E669 Obesity, unspecified: Secondary | ICD-10-CM | POA: Diagnosis not present

## 2018-04-22 DIAGNOSIS — H25013 Cortical age-related cataract, bilateral: Secondary | ICD-10-CM | POA: Diagnosis not present

## 2018-04-22 DIAGNOSIS — E113292 Type 2 diabetes mellitus with mild nonproliferative diabetic retinopathy without macular edema, left eye: Secondary | ICD-10-CM | POA: Diagnosis not present

## 2018-04-22 DIAGNOSIS — H401111 Primary open-angle glaucoma, right eye, mild stage: Secondary | ICD-10-CM | POA: Diagnosis not present

## 2018-04-22 DIAGNOSIS — H2513 Age-related nuclear cataract, bilateral: Secondary | ICD-10-CM | POA: Diagnosis not present

## 2018-06-04 DIAGNOSIS — E559 Vitamin D deficiency, unspecified: Secondary | ICD-10-CM | POA: Diagnosis not present

## 2018-06-04 DIAGNOSIS — Z0001 Encounter for general adult medical examination with abnormal findings: Secondary | ICD-10-CM | POA: Diagnosis not present

## 2018-06-04 DIAGNOSIS — I10 Essential (primary) hypertension: Secondary | ICD-10-CM | POA: Diagnosis not present

## 2018-06-04 DIAGNOSIS — Z79899 Other long term (current) drug therapy: Secondary | ICD-10-CM | POA: Diagnosis not present

## 2018-06-04 DIAGNOSIS — E1165 Type 2 diabetes mellitus with hyperglycemia: Secondary | ICD-10-CM | POA: Diagnosis not present

## 2018-06-04 DIAGNOSIS — Z23 Encounter for immunization: Secondary | ICD-10-CM | POA: Diagnosis not present

## 2018-06-10 MED FILL — HUMALOG 100 UNITS/ML KWIKPE: 100 | 89 days supply | Qty: 75 | Fill #1

## 2018-06-10 MED FILL — LANTUS SOLOSTAR 100 UNITS/M: 100 | 80 days supply | Qty: 21 | Fill #3

## 2018-06-14 MED FILL — ATORVASTATIN 10 MG TABLET: 10 | 90 days supply | Qty: 90 | Fill #0

## 2018-06-14 MED FILL — LISINOPRIL 10 MG TABLET: 10 | 90 days supply | Qty: 90 | Fill #0

## 2018-07-25 DIAGNOSIS — E669 Obesity, unspecified: Secondary | ICD-10-CM | POA: Diagnosis not present

## 2018-07-25 DIAGNOSIS — Z794 Long term (current) use of insulin: Secondary | ICD-10-CM | POA: Diagnosis not present

## 2018-07-25 DIAGNOSIS — E1165 Type 2 diabetes mellitus with hyperglycemia: Secondary | ICD-10-CM | POA: Diagnosis not present

## 2018-07-25 MED FILL — ACCU-CHEK GUIDE TEST STRIP: 90 days supply | Qty: 300 | Fill #0

## 2018-09-04 MED FILL — ACCU-CHEK SOFTCLIX LANCETS: 33 days supply | Qty: 100 | Fill #0

## 2018-09-04 MED FILL — LATANOPROST 0.005% EYE DRP: 0.005 | 75 days supply | Qty: 8 | Fill #1

## 2018-09-04 MED FILL — ACCU-CHEK GUIDE TEST STRIP: 90 days supply | Qty: 300 | Fill #0

## 2018-09-13 DIAGNOSIS — Z1159 Encounter for screening for other viral diseases: Secondary | ICD-10-CM | POA: Diagnosis not present

## 2018-10-16 DIAGNOSIS — Z23 Encounter for immunization: Secondary | ICD-10-CM | POA: Diagnosis not present

## 2018-11-04 MED FILL — HUMALOG 100 UNITS/ML KWIKPE: 100 | 89 days supply | Qty: 75 | Fill #2

## 2018-11-04 MED FILL — LANTUS SOLOSTAR 100 UNITS/M: 100 | 90 days supply | Qty: 30 | Fill #0

## 2018-11-26 DIAGNOSIS — E669 Obesity, unspecified: Secondary | ICD-10-CM | POA: Diagnosis not present

## 2018-11-26 DIAGNOSIS — Z794 Long term (current) use of insulin: Secondary | ICD-10-CM | POA: Diagnosis not present

## 2018-11-26 DIAGNOSIS — E1165 Type 2 diabetes mellitus with hyperglycemia: Secondary | ICD-10-CM | POA: Diagnosis not present

## 2018-11-26 DIAGNOSIS — M79671 Pain in right foot: Secondary | ICD-10-CM | POA: Diagnosis not present

## 2019-01-08 MED FILL — LISINOPRIL 10 MG TABS: 10 | 90 days supply | Qty: 90 | Fill #1

## 2019-01-08 MED FILL — ACCU-CHEK GUIDE TEST STRIP: 90 days supply | Qty: 300 | Fill #1

## 2019-02-24 MED FILL — LANTUS SOLOSTAR 100 UNITS/M: 100 | 90 days supply | Qty: 30 | Fill #1

## 2019-02-25 DIAGNOSIS — Z794 Long term (current) use of insulin: Secondary | ICD-10-CM | POA: Diagnosis not present

## 2019-02-25 DIAGNOSIS — E1165 Type 2 diabetes mellitus with hyperglycemia: Secondary | ICD-10-CM | POA: Diagnosis not present

## 2019-02-25 DIAGNOSIS — E669 Obesity, unspecified: Secondary | ICD-10-CM | POA: Diagnosis not present

## 2019-02-28 MED FILL — NOVOLOG FLEXPEN SYRINGE: 100 | 35 days supply | Qty: 30 | Fill #0

## 2019-02-28 MED FILL — UNIFINE PENTIPS 8MM 31G: 31G X 8 MM | 90 days supply | Qty: 300 | Fill #0

## 2019-03-04 MED FILL — HUMALOG 100 UNITS/ML KWIKPE: 100 | 90 days supply | Qty: 75 | Fill #0

## 2019-05-26 DIAGNOSIS — Z794 Long term (current) use of insulin: Secondary | ICD-10-CM | POA: Diagnosis not present

## 2019-05-26 DIAGNOSIS — E669 Obesity, unspecified: Secondary | ICD-10-CM | POA: Diagnosis not present

## 2019-05-26 DIAGNOSIS — E1165 Type 2 diabetes mellitus with hyperglycemia: Secondary | ICD-10-CM | POA: Diagnosis not present

## 2019-05-26 MED FILL — TRULICITY 0.75 MG/0.5 ML PE: 0.75 | 84 days supply | Qty: 6 | Fill #0

## 2019-06-13 MED FILL — PANTOPRAZOLE SOD DR 40 MG T: 40 | 90 days supply | Qty: 90 | Fill #0

## 2019-07-04 ENCOUNTER — Other Ambulatory Visit (HOSPITAL_COMMUNITY): Payer: Self-pay | Admitting: Internal Medicine

## 2019-07-04 DIAGNOSIS — Z794 Long term (current) use of insulin: Secondary | ICD-10-CM | POA: Diagnosis not present

## 2019-07-04 DIAGNOSIS — E669 Obesity, unspecified: Secondary | ICD-10-CM | POA: Diagnosis not present

## 2019-07-04 DIAGNOSIS — E1165 Type 2 diabetes mellitus with hyperglycemia: Secondary | ICD-10-CM | POA: Diagnosis not present

## 2019-07-04 MED FILL — TRULICITY 1.5 MG/0.5 ML PEN: 1.5 | 84 days supply | Qty: 6 | Fill #0

## 2019-07-07 DIAGNOSIS — H401111 Primary open-angle glaucoma, right eye, mild stage: Secondary | ICD-10-CM | POA: Diagnosis not present

## 2019-07-07 DIAGNOSIS — H524 Presbyopia: Secondary | ICD-10-CM | POA: Diagnosis not present

## 2019-07-08 MED FILL — LATANOPROST 0.005% OPTH SOL: 0.005 | 75 days supply | Qty: 8 | Fill #0

## 2019-07-22 MED FILL — BASAGLAR 100 UNIT/ML KWIKPE: 100 | 90 days supply | Qty: 45 | Fill #0

## 2019-07-22 MED FILL — LATANOPROST 0.005% OPTH SOL: 0.005 | 75 days supply | Qty: 8 | Fill #0

## 2019-07-22 MED FILL — TRULICITY 1.5 MG/0.5 ML PEN: 1.5 | 84 days supply | Qty: 6 | Fill #0

## 2019-07-22 MED FILL — HUMALOG 100 UNITS/ML KWIKPE: 100 | 90 days supply | Qty: 75 | Fill #1

## 2019-07-28 DIAGNOSIS — R519 Headache, unspecified: Secondary | ICD-10-CM | POA: Diagnosis not present

## 2019-07-28 DIAGNOSIS — L309 Dermatitis, unspecified: Secondary | ICD-10-CM | POA: Diagnosis not present

## 2019-07-28 DIAGNOSIS — I10 Essential (primary) hypertension: Secondary | ICD-10-CM | POA: Diagnosis not present

## 2019-07-28 DIAGNOSIS — Z79899 Other long term (current) drug therapy: Secondary | ICD-10-CM | POA: Diagnosis not present

## 2019-07-28 DIAGNOSIS — J309 Allergic rhinitis, unspecified: Secondary | ICD-10-CM | POA: Diagnosis not present

## 2019-07-28 DIAGNOSIS — E119 Type 2 diabetes mellitus without complications: Secondary | ICD-10-CM | POA: Diagnosis not present

## 2019-07-28 DIAGNOSIS — Z0000 Encounter for general adult medical examination without abnormal findings: Secondary | ICD-10-CM | POA: Diagnosis not present

## 2019-07-28 DIAGNOSIS — K219 Gastro-esophageal reflux disease without esophagitis: Secondary | ICD-10-CM | POA: Diagnosis not present

## 2019-07-28 DIAGNOSIS — E559 Vitamin D deficiency, unspecified: Secondary | ICD-10-CM | POA: Diagnosis not present

## 2019-08-05 DIAGNOSIS — H401111 Primary open-angle glaucoma, right eye, mild stage: Secondary | ICD-10-CM | POA: Diagnosis not present

## 2019-08-05 DIAGNOSIS — H2513 Age-related nuclear cataract, bilateral: Secondary | ICD-10-CM | POA: Diagnosis not present

## 2019-08-06 ENCOUNTER — Other Ambulatory Visit: Payer: Self-pay | Admitting: Family Medicine

## 2019-08-06 ENCOUNTER — Other Ambulatory Visit (HOSPITAL_COMMUNITY): Payer: Self-pay | Admitting: Family Medicine

## 2019-08-06 DIAGNOSIS — R519 Headache, unspecified: Secondary | ICD-10-CM

## 2019-08-06 DIAGNOSIS — I639 Cerebral infarction, unspecified: Secondary | ICD-10-CM

## 2019-08-26 ENCOUNTER — Ambulatory Visit (HOSPITAL_COMMUNITY): Admission: RE | Admit: 2019-08-26 | Payer: 59 | Source: Ambulatory Visit

## 2019-09-05 ENCOUNTER — Ambulatory Visit (HOSPITAL_COMMUNITY)
Admission: RE | Admit: 2019-09-05 | Discharge: 2019-09-05 | Disposition: A | Discharge status: Home / Self Care | Payer: 59 | Source: Ambulatory Visit | Attending: Family Medicine | Admitting: Family Medicine

## 2019-09-05 ENCOUNTER — Other Ambulatory Visit: Payer: Self-pay

## 2019-09-05 DIAGNOSIS — R519 Headache, unspecified: Secondary | ICD-10-CM | POA: Diagnosis not present

## 2019-09-05 DIAGNOSIS — I639 Cerebral infarction, unspecified: Secondary | ICD-10-CM | POA: Diagnosis not present

## 2019-09-19 ENCOUNTER — Other Ambulatory Visit (HOSPITAL_COMMUNITY): Payer: Self-pay | Admitting: Internal Medicine

## 2019-09-19 MED FILL — FREESTYLE LITE TEST STRIP: 90 days supply | Qty: 300 | Fill #0

## 2019-09-19 MED FILL — FREESTYLE LITE METER: 30 days supply | Qty: 1 | Fill #0

## 2019-09-19 MED FILL — FREESTYLE LANCETS: 90 days supply | Qty: 300 | Fill #0

## 2019-09-20 MED FILL — KETOROLAC 0.5% OPHTH SOLUTI: 0.5 | 25 days supply | Qty: 5 | Fill #0

## 2019-09-20 MED FILL — PREDNISOLONE AC 1% EYE DROP: 1 | 50 days supply | Qty: 10 | Fill #0

## 2019-09-20 MED FILL — MOXIFLOXACIN HCL 0.5 % SOLN: 0.5 | 15 days supply | Qty: 3 | Fill #0

## 2019-10-01 DIAGNOSIS — H2511 Age-related nuclear cataract, right eye: Secondary | ICD-10-CM | POA: Diagnosis not present

## 2019-10-01 DIAGNOSIS — H25811 Combined forms of age-related cataract, right eye: Secondary | ICD-10-CM | POA: Diagnosis not present

## 2019-10-01 DIAGNOSIS — H401111 Primary open-angle glaucoma, right eye, mild stage: Secondary | ICD-10-CM | POA: Diagnosis not present

## 2019-10-17 DIAGNOSIS — Z794 Long term (current) use of insulin: Secondary | ICD-10-CM | POA: Diagnosis not present

## 2019-10-17 DIAGNOSIS — E1165 Type 2 diabetes mellitus with hyperglycemia: Secondary | ICD-10-CM | POA: Diagnosis not present

## 2019-10-17 DIAGNOSIS — E669 Obesity, unspecified: Secondary | ICD-10-CM | POA: Diagnosis not present

## 2019-10-17 MED FILL — JARDIANCE 10 MG TABLET: 10 | 90 days supply | Qty: 90 | Fill #0

## 2019-10-20 MED FILL — TRULICITY 1.5 MG/0.5 ML PEN: 1.5 | 84 days supply | Qty: 6 | Fill #1

## 2019-10-20 MED FILL — MOXIFLOXACIN HCL 0.5 % SOLN: 0.5 | 7 days supply | Qty: 3 | Fill #0

## 2019-10-22 DIAGNOSIS — H401121 Primary open-angle glaucoma, left eye, mild stage: Secondary | ICD-10-CM | POA: Diagnosis not present

## 2019-10-22 DIAGNOSIS — H401122 Primary open-angle glaucoma, left eye, moderate stage: Secondary | ICD-10-CM | POA: Diagnosis not present

## 2019-10-22 DIAGNOSIS — H2512 Age-related nuclear cataract, left eye: Secondary | ICD-10-CM | POA: Diagnosis not present

## 2019-10-30 MED FILL — PREDNISOLONE AC 1% EYE DROP: 1 | 50 days supply | Qty: 10 | Fill #0

## 2019-10-31 MED FILL — KETOROLAC 0.5% OPHTH SOLN: 0.5 | 25 days supply | Qty: 5 | Fill #1

## 2020-01-21 MED FILL — FREESTYLE LITE TEST STRIP: 90 days supply | Qty: 300 | Fill #1

## 2020-01-21 MED FILL — HUMALOG 100 UNITS/ML KWIKPE: 100 | 90 days supply | Qty: 75 | Fill #2

## 2020-01-21 MED FILL — JARDIANCE 10 MG TABLET: 10 | 90 days supply | Qty: 90 | Fill #1

## 2020-01-23 DIAGNOSIS — Z23 Encounter for immunization: Secondary | ICD-10-CM | POA: Diagnosis not present

## 2020-01-23 MED FILL — TRULICITY 1.5 MG/0.5 ML PEN: 1.5 | 84 days supply | Qty: 6 | Fill #2

## 2020-02-17 ENCOUNTER — Other Ambulatory Visit (HOSPITAL_COMMUNITY): Payer: Self-pay | Admitting: Internal Medicine

## 2020-02-17 DIAGNOSIS — E1165 Type 2 diabetes mellitus with hyperglycemia: Secondary | ICD-10-CM | POA: Diagnosis not present

## 2020-02-17 DIAGNOSIS — Z794 Long term (current) use of insulin: Secondary | ICD-10-CM | POA: Diagnosis not present

## 2020-02-17 DIAGNOSIS — E669 Obesity, unspecified: Secondary | ICD-10-CM | POA: Diagnosis not present

## 2020-02-17 MED FILL — ATORVASTATIN CALCIUM 10 MG: 10 | 90 days supply | Qty: 90 | Fill #0

## 2020-02-17 MED FILL — JARDIANCE 25 MG TABLET: 25 | 90 days supply | Qty: 90 | Fill #0

## 2020-02-17 MED FILL — TRULICITY 0.75 MG/0.5 ML PE: 0.75 | 84 days supply | Qty: 6 | Fill #0

## 2020-02-17 MED FILL — LISINOPRIL 10 MG TABS: 10 | 90 days supply | Qty: 90 | Fill #0

## 2020-04-27 DIAGNOSIS — Z961 Presence of intraocular lens: Secondary | ICD-10-CM | POA: Diagnosis not present

## 2020-04-27 DIAGNOSIS — H401122 Primary open-angle glaucoma, left eye, moderate stage: Secondary | ICD-10-CM | POA: Diagnosis not present

## 2020-05-19 DIAGNOSIS — E669 Obesity, unspecified: Secondary | ICD-10-CM | POA: Diagnosis not present

## 2020-05-19 DIAGNOSIS — E1165 Type 2 diabetes mellitus with hyperglycemia: Secondary | ICD-10-CM | POA: Diagnosis not present

## 2020-05-19 DIAGNOSIS — Z794 Long term (current) use of insulin: Secondary | ICD-10-CM | POA: Diagnosis not present

## 2020-05-21 MED FILL — JARDIANCE 25 MG TABLET: 25 | 90 days supply | Qty: 90 | Fill #1

## 2020-05-21 MED FILL — TRULICITY 0.75 MG/0.5 ML PE: 0.75 | 84 days supply | Qty: 6 | Fill #1

## 2020-05-21 MED FILL — LISINOPRIL 10 MG TABS: 10 | 90 days supply | Qty: 90 | Fill #1

## 2020-05-22 MED FILL — FREESTYLE LITE TEST STRIP: 90 days supply | Qty: 300 | Fill #2

## 2020-05-22 MED FILL — ATORVASTATIN CALCIUM 10 MG: 10 | 90 days supply | Qty: 90 | Fill #1

## 2020-08-05 DIAGNOSIS — I10 Essential (primary) hypertension: Secondary | ICD-10-CM | POA: Diagnosis not present

## 2020-08-05 DIAGNOSIS — Z794 Long term (current) use of insulin: Secondary | ICD-10-CM | POA: Diagnosis not present

## 2020-08-05 DIAGNOSIS — E559 Vitamin D deficiency, unspecified: Secondary | ICD-10-CM | POA: Diagnosis not present

## 2020-08-05 DIAGNOSIS — Z79899 Other long term (current) drug therapy: Secondary | ICD-10-CM | POA: Diagnosis not present

## 2020-08-05 DIAGNOSIS — E1165 Type 2 diabetes mellitus with hyperglycemia: Secondary | ICD-10-CM | POA: Diagnosis not present

## 2020-08-10 ENCOUNTER — Other Ambulatory Visit: Payer: Self-pay | Admitting: Family Medicine

## 2020-08-10 ENCOUNTER — Other Ambulatory Visit (HOSPITAL_COMMUNITY)
Admission: RE | Admit: 2020-08-10 | Discharge: 2020-08-10 | Disposition: A | Discharge status: Home / Self Care | Payer: 59 | Source: Ambulatory Visit | Attending: Family Medicine | Admitting: Family Medicine

## 2020-08-10 ENCOUNTER — Other Ambulatory Visit (HOSPITAL_COMMUNITY): Payer: Self-pay

## 2020-08-10 DIAGNOSIS — Z0000 Encounter for general adult medical examination without abnormal findings: Secondary | ICD-10-CM | POA: Diagnosis not present

## 2020-08-10 DIAGNOSIS — K219 Gastro-esophageal reflux disease without esophagitis: Secondary | ICD-10-CM | POA: Diagnosis not present

## 2020-08-10 DIAGNOSIS — Z01411 Encounter for gynecological examination (general) (routine) with abnormal findings: Secondary | ICD-10-CM | POA: Insufficient documentation

## 2020-08-10 DIAGNOSIS — J309 Allergic rhinitis, unspecified: Secondary | ICD-10-CM | POA: Diagnosis not present

## 2020-08-10 DIAGNOSIS — I10 Essential (primary) hypertension: Secondary | ICD-10-CM | POA: Diagnosis not present

## 2020-08-10 DIAGNOSIS — E1165 Type 2 diabetes mellitus with hyperglycemia: Secondary | ICD-10-CM | POA: Diagnosis not present

## 2020-08-10 DIAGNOSIS — E559 Vitamin D deficiency, unspecified: Secondary | ICD-10-CM | POA: Diagnosis not present

## 2020-08-10 DIAGNOSIS — Z79899 Other long term (current) drug therapy: Secondary | ICD-10-CM | POA: Diagnosis not present

## 2020-08-10 MED ORDER — AZELASTINE HCL 137 MCG/SPRAY NA SOLN
NASAL | 12 refills | Status: AC
  Filled 2020-08-10: qty 30, 50d supply, fill #0

## 2020-08-10 MED ORDER — PANTOPRAZOLE SODIUM 40 MG PO TBEC
40.0000 mg | DELAYED_RELEASE_TABLET | Freq: Every day | ORAL | 4 refills | Status: DC
  Filled 2020-08-10: qty 90, 90d supply, fill #0

## 2020-08-13 ENCOUNTER — Other Ambulatory Visit (HOSPITAL_COMMUNITY): Payer: Self-pay

## 2020-08-13 LAB — CYTOLOGY - PAP: Diagnosis: NEGATIVE

## 2020-08-13 MED FILL — Empagliflozin Tab 25 MG: ORAL | 90 days supply | Qty: 90 | Fill #0 | Status: AC

## 2020-08-13 MED FILL — Lisinopril Tab 10 MG: ORAL | 90 days supply | Qty: 90 | Fill #0 | Status: AC

## 2020-08-13 MED FILL — Dulaglutide Soln Auto-injector 0.75 MG/0.5ML: SUBCUTANEOUS | 84 days supply | Qty: 6 | Fill #0 | Status: AC

## 2020-08-16 DIAGNOSIS — H1131 Conjunctival hemorrhage, right eye: Secondary | ICD-10-CM | POA: Diagnosis not present

## 2020-08-26 ENCOUNTER — Other Ambulatory Visit (HOSPITAL_COMMUNITY): Payer: Self-pay

## 2020-08-26 DIAGNOSIS — E669 Obesity, unspecified: Secondary | ICD-10-CM | POA: Diagnosis not present

## 2020-08-26 DIAGNOSIS — Z794 Long term (current) use of insulin: Secondary | ICD-10-CM | POA: Diagnosis not present

## 2020-08-26 DIAGNOSIS — E1165 Type 2 diabetes mellitus with hyperglycemia: Secondary | ICD-10-CM | POA: Diagnosis not present

## 2020-08-26 MED ORDER — TRULICITY 0.75 MG/0.5ML ~~LOC~~ SOAJ
SUBCUTANEOUS | 4 refills | Status: DC
  Filled 2020-08-26 – 2020-11-05 (×2): qty 6, 84d supply, fill #0
  Filled 2021-02-07: qty 6, 84d supply, fill #1

## 2020-09-02 ENCOUNTER — Ambulatory Visit: Payer: 59 | Admitting: Podiatry

## 2020-09-02 ENCOUNTER — Other Ambulatory Visit: Payer: Self-pay

## 2020-09-02 ENCOUNTER — Other Ambulatory Visit (HOSPITAL_COMMUNITY): Payer: Self-pay

## 2020-09-02 ENCOUNTER — Encounter: Payer: Self-pay | Admitting: Podiatry

## 2020-09-02 ENCOUNTER — Ambulatory Visit (INDEPENDENT_AMBULATORY_CARE_PROVIDER_SITE_OTHER): Payer: 59

## 2020-09-02 DIAGNOSIS — M722 Plantar fascial fibromatosis: Secondary | ICD-10-CM

## 2020-09-02 DIAGNOSIS — M25571 Pain in right ankle and joints of right foot: Secondary | ICD-10-CM

## 2020-09-02 MED ORDER — DICLOFENAC SODIUM 75 MG PO TBEC
75.0000 mg | DELAYED_RELEASE_TABLET | Freq: Two times a day (BID) | ORAL | 2 refills | Status: AC
  Filled 2020-09-02: qty 50, 25d supply, fill #0

## 2020-09-02 NOTE — Patient Instructions (Signed)

## 2020-09-04 ENCOUNTER — Other Ambulatory Visit (HOSPITAL_COMMUNITY): Payer: Self-pay

## 2020-09-07 NOTE — Progress Notes (Signed)
Subjective:   Patient ID: Mary Delgado, female   DOB: 60 y.o.   MRN: [id number]   HPI Patient presents stating she has pain on the back of her right ankle and outside of her ankle and states that it has started to bother her.  States the left foot has been doing well and been mostly in the right foot.  Patient also points to the bottom of the right heel   ROS      Objective:  Physical Exam  Neurovascular status intact with inflammation pain around the plantar fascia and into the medial ankle lateral ankle right     Assessment:  Planter fasciitis with inflammatory condition and just generalized inflammation of her feet which has been present for a number of years     Plan:  H&P and went ahead today sterile prep and injected the fascia 3 mg Kenalog 5 mg Xylocaine around the ankle.  Advised on reduced activity reappoint as symptoms indicate  X-rays indicate no signs of significant spurring formation or stress fracture with moderate depression of the arch noted       Patient

## 2020-09-08 ENCOUNTER — Other Ambulatory Visit (HOSPITAL_COMMUNITY): Payer: Self-pay

## 2020-09-08 MED ORDER — INSULIN GLARGINE-YFGN 100 UNIT/ML ~~LOC~~ SOPN
PEN_INJECTOR | SUBCUTANEOUS | 3 refills | Status: DC
  Filled 2020-09-08: qty 45, 90d supply, fill #0

## 2020-09-08 MED ORDER — BASAGLAR KWIKPEN 100 UNIT/ML ~~LOC~~ SOPN
PEN_INJECTOR | SUBCUTANEOUS | 3 refills | Status: DC
  Filled 2020-09-08: qty 45, 90d supply, fill #0

## 2020-09-08 MED ORDER — INSULIN LISPRO (1 UNIT DIAL) 100 UNIT/ML (KWIKPEN)
PEN_INJECTOR | SUBCUTANEOUS | 3 refills | Status: DC
  Filled 2020-09-08 (×2): qty 90, 90d supply, fill #0

## 2020-09-09 ENCOUNTER — Other Ambulatory Visit (HOSPITAL_COMMUNITY): Payer: Self-pay

## 2020-09-22 ENCOUNTER — Telehealth: Payer: Self-pay | Admitting: Podiatry

## 2020-09-22 IMAGING — MR MR HEAD W/O CM
11 of 12 series · 39 of 48 positions shown · non-contrast
Comparison: Cervical spine radiographs 02/13/2014.

CLINICAL DATA: 58-year-old female with increasing headaches.

EXAM:
MRI HEAD WITHOUT CONTRAST
TECHNIQUE: Multiplanar, multiecho pulse sequences of the brain and surrounding
structures were obtained without intravenous contrast.

[Series 5: DWI · axial · 3.0mm · 1.36mm/px · z∈[-58,+83]mm · 6 of 96 slices shown (1 of 4)]
[im 1/96]
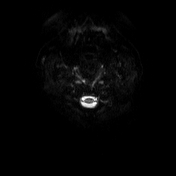
[im 20/96]
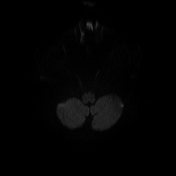
[im 39/96]
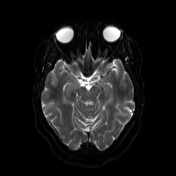
[im 58/96]
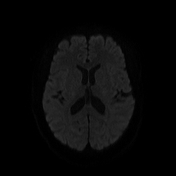
[im 77/96]
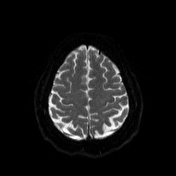
[im 96/96]
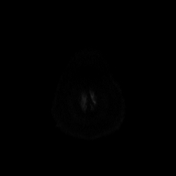

[Series 6: DWI · axial · 3.0mm · 1.36mm/px · z∈[-58,+83]mm · 3 of 48 slices shown (2 of 4)]
[im 1/48]
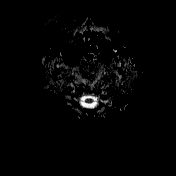
[im 24/48]
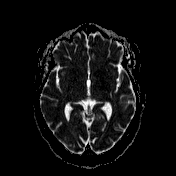
[im 48/48]
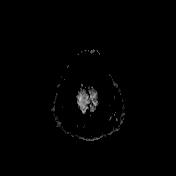

[Series 7: T1 · sagittal · 5.0mm · 0.75mm/px · 2 of 24 slices shown (1 of 2)]
[im 1/24]
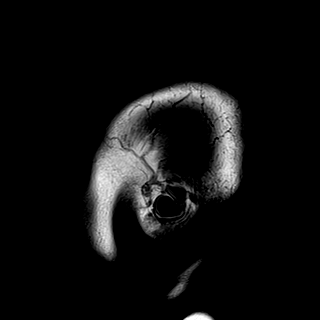
[im 24/24]
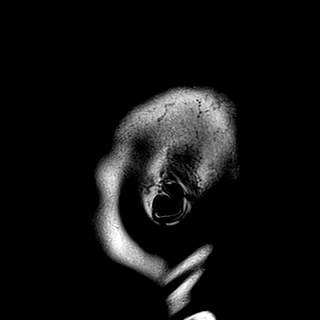

[Series 8: T2 · axial · 5.0mm · 0.62mm/px · 1 of 22 slices shown (1 of 2)]
[im 1/22]
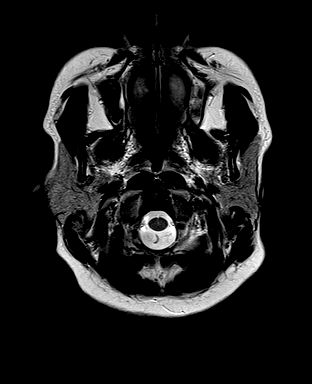

[Series 9: mip_images(sw) · axial · 24.0mm · 0.75mm/px · z∈[-42,+77]mm · 3 of 41 slices shown]
[im 1/41]
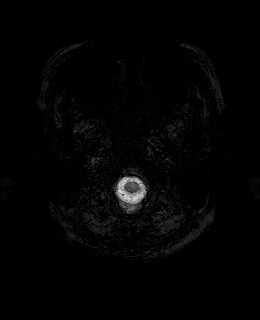
[im 21/41]
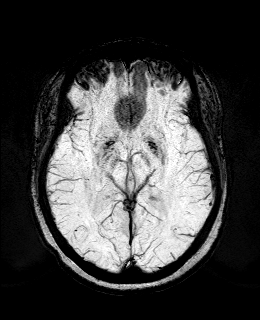
[im 41/41]
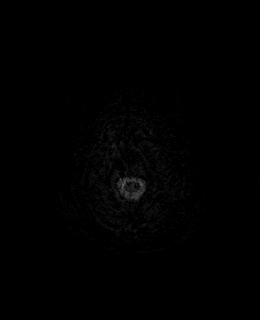

[Series 10: swi_images · axial · 3.0mm · 0.75mm/px · z∈[-53,+88]mm · 3 of 48 slices shown]
[im 1/48]
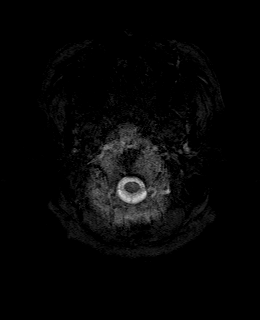
[im 24/48]
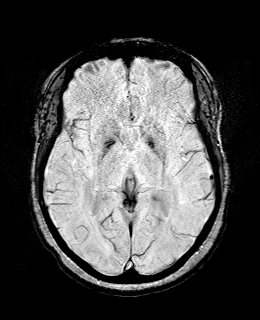
[im 48/48]
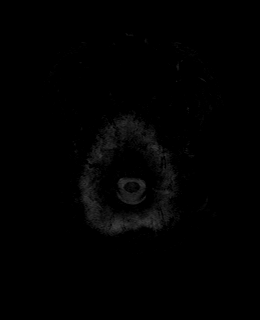

[Series 11: FLAIR · axial · 3.0mm · 0.75mm/px · z∈[-53,+88]mm · 3 of 48 slices shown]
[im 1/48]
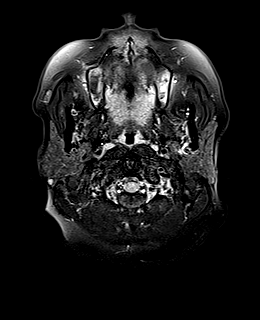
[im 24/48]
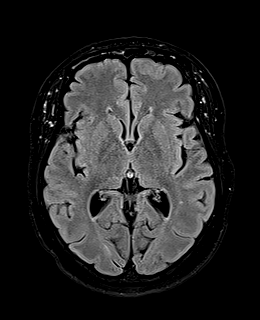
[im 48/48]
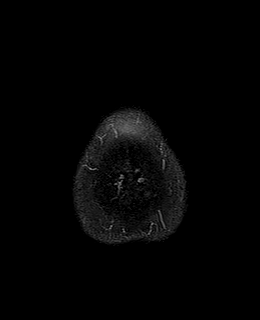

[Series 12: T1 · axial · 1.0mm · 0.94mm/px · z∈[-54,+89]mm · 10 of 144 slices shown (2 of 2)]
[im 1/144]
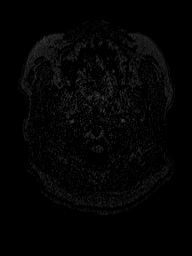
[im 16/144]
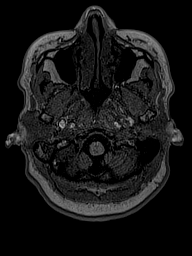
[im 32/144]
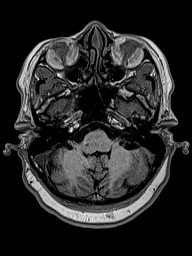
[im 48/144]
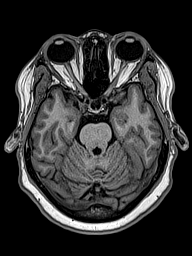
[im 64/144]
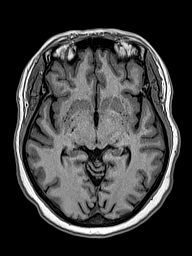
[im 80/144]
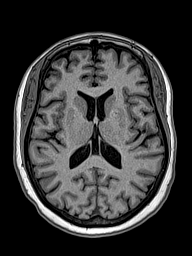
[im 96/144]
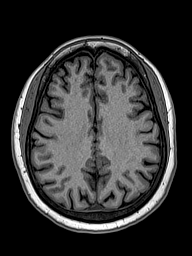
[im 112/144]
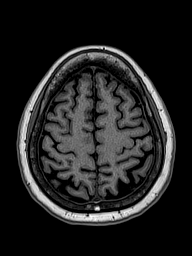
[im 128/144]
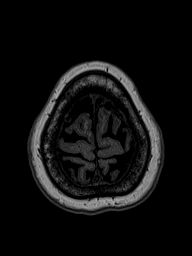
[im 144/144]
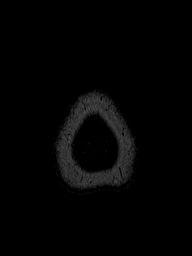

[Series 13: DWI · coronal · 5.0mm · 1.31mm/px · 4 of 64 slices shown (3 of 4)]
[im 1/64]
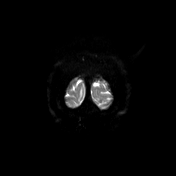
[im 22/64]
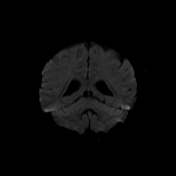
[im 43/64]
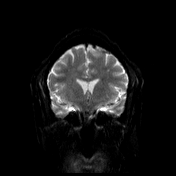
[im 64/64]
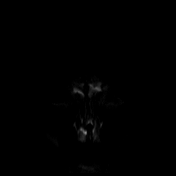

[Series 14: DWI · coronal · 5.0mm · 1.31mm/px · 2 of 32 slices shown (4 of 4)]
[im 1/32]
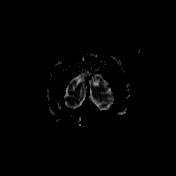
[im 32/32]
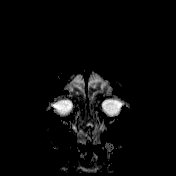

[Series 15: T2 · coronal · 5.0mm · 0.57mm/px · 2 of 28 slices shown (2 of 2)]
[im 1/28]
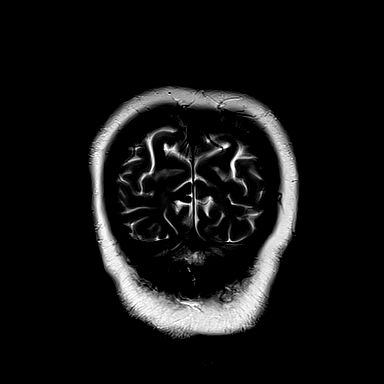
[im 28/28]
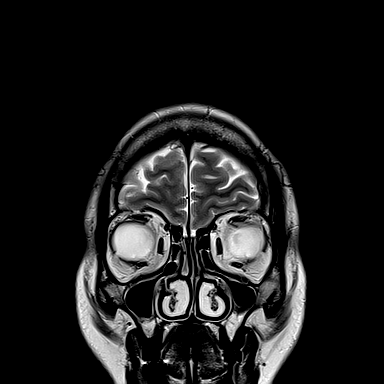

[39 of 48 positions shown; findings below may reference images not displayed]

FINDINGS: Brain: Cerebral volume is within normal limits for age. No
restricted diffusion to suggest acute infarction. No midline shift,
mass effect, evidence of mass lesion, ventriculomegaly, extra-axial
collection or acute intracranial hemorrhage. Cervicomedullary
junction and pituitary are within normal limits.

Largely normal for age gray and white matter signal throughout the
brain. Scattered tiny, mostly subcortical white matter T2 and FLAIR
hyperintensity in both hemispheres are nonspecific. No cortical
encephalomalacia or chronic cerebral blood products. Deep gray
matter nuclei, brainstem and cerebellum appear normal.

Vascular: Major intracranial vascular flow voids are preserved, the
left vertebral artery appears dominant.

Skull and upper cervical spine: Negative visible cervical spine.
Calvarium bone marrow signal is heterogeneous, but no destructive or
suspicious osseous lesions are identified.

Sinuses/Orbits: Negative.

Other: Mastoids are clear. Visible internal auditory structures
appear normal. Scalp and face soft tissues appear negative.
IMPRESSION: No acute intracranial abnormality. Essentially normal for age
noncontrast MRI appearance of the brain.

## 2020-09-22 NOTE — Telephone Encounter (Signed)
Orthotics in.. lvm for pt to call to schedule an appt to pick them up. °

## 2020-10-01 ENCOUNTER — Other Ambulatory Visit (HOSPITAL_COMMUNITY): Payer: Self-pay

## 2020-10-28 DIAGNOSIS — H401122 Primary open-angle glaucoma, left eye, moderate stage: Secondary | ICD-10-CM | POA: Diagnosis not present

## 2020-10-28 DIAGNOSIS — H26493 Other secondary cataract, bilateral: Secondary | ICD-10-CM | POA: Diagnosis not present

## 2020-11-05 ENCOUNTER — Other Ambulatory Visit (HOSPITAL_COMMUNITY): Payer: Self-pay

## 2020-11-06 ENCOUNTER — Other Ambulatory Visit (HOSPITAL_COMMUNITY): Payer: Self-pay

## 2020-11-08 ENCOUNTER — Other Ambulatory Visit (HOSPITAL_COMMUNITY): Payer: Self-pay

## 2020-11-08 MED ORDER — GLUCOSE BLOOD VI STRP
ORAL_STRIP | 3 refills | Status: DC
  Filled 2020-11-08: qty 300, 90d supply, fill #0
  Filled 2021-05-12: qty 300, 90d supply, fill #1

## 2020-12-01 ENCOUNTER — Other Ambulatory Visit (HOSPITAL_COMMUNITY): Payer: Self-pay

## 2020-12-01 MED FILL — Lisinopril Tab 10 MG: ORAL | 90 days supply | Qty: 90 | Fill #1 | Status: AC

## 2020-12-01 MED FILL — Empagliflozin Tab 25 MG: ORAL | 90 days supply | Qty: 90 | Fill #1 | Status: AC

## 2020-12-02 DIAGNOSIS — Z794 Long term (current) use of insulin: Secondary | ICD-10-CM | POA: Diagnosis not present

## 2020-12-02 DIAGNOSIS — E1165 Type 2 diabetes mellitus with hyperglycemia: Secondary | ICD-10-CM | POA: Diagnosis not present

## 2020-12-02 DIAGNOSIS — E669 Obesity, unspecified: Secondary | ICD-10-CM | POA: Diagnosis not present

## 2020-12-02 DIAGNOSIS — Z7984 Long term (current) use of oral hypoglycemic drugs: Secondary | ICD-10-CM | POA: Diagnosis not present

## 2020-12-30 ENCOUNTER — Other Ambulatory Visit (HOSPITAL_COMMUNITY): Payer: Self-pay

## 2020-12-30 MED FILL — Lisinopril Tab 10 MG: ORAL | 90 days supply | Qty: 90 | Fill #2 | Status: AC

## 2020-12-30 MED FILL — Empagliflozin Tab 25 MG: ORAL | 60 days supply | Qty: 60 | Fill #2 | Status: AC

## 2021-02-07 ENCOUNTER — Other Ambulatory Visit (HOSPITAL_COMMUNITY): Payer: Self-pay

## 2021-02-07 MED ORDER — TRULICITY 1.5 MG/0.5ML ~~LOC~~ SOAJ
SUBCUTANEOUS | 11 refills | Status: AC
  Filled 2021-02-07: qty 6, 84d supply, fill #0
  Filled 2021-05-12: qty 6, 84d supply, fill #1

## 2021-02-09 ENCOUNTER — Other Ambulatory Visit (HOSPITAL_COMMUNITY): Payer: Self-pay

## 2021-02-25 ENCOUNTER — Other Ambulatory Visit (HOSPITAL_COMMUNITY): Payer: Self-pay

## 2021-02-26 ENCOUNTER — Other Ambulatory Visit (HOSPITAL_COMMUNITY): Payer: Self-pay

## 2021-03-02 ENCOUNTER — Other Ambulatory Visit (HOSPITAL_COMMUNITY): Payer: Self-pay

## 2021-03-02 MED ORDER — JARDIANCE 25 MG PO TABS
25.0000 mg | ORAL_TABLET | Freq: Every day | ORAL | 3 refills | Status: DC
  Filled 2021-03-02 – 2021-03-16 (×2): qty 90, 90d supply, fill #0
  Filled 2021-06-24: qty 90, 90d supply, fill #1
  Filled 2021-10-07: qty 90, 90d supply, fill #2
  Filled 2022-01-20: qty 90, 90d supply, fill #3

## 2021-03-11 ENCOUNTER — Other Ambulatory Visit (HOSPITAL_COMMUNITY): Payer: Self-pay

## 2021-03-16 ENCOUNTER — Other Ambulatory Visit (HOSPITAL_COMMUNITY): Payer: Self-pay

## 2021-03-16 MED ORDER — LISINOPRIL 10 MG PO TABS
ORAL_TABLET | ORAL | 3 refills | Status: AC
  Filled 2021-03-16: qty 90, 90d supply, fill #0
  Filled 2021-10-29: qty 90, 90d supply, fill #1

## 2021-03-17 ENCOUNTER — Other Ambulatory Visit (HOSPITAL_COMMUNITY): Payer: Self-pay

## 2021-04-18 DIAGNOSIS — H401122 Primary open-angle glaucoma, left eye, moderate stage: Secondary | ICD-10-CM | POA: Diagnosis not present

## 2021-04-27 DIAGNOSIS — Z794 Long term (current) use of insulin: Secondary | ICD-10-CM | POA: Diagnosis not present

## 2021-04-27 DIAGNOSIS — E1165 Type 2 diabetes mellitus with hyperglycemia: Secondary | ICD-10-CM | POA: Diagnosis not present

## 2021-04-27 DIAGNOSIS — E669 Obesity, unspecified: Secondary | ICD-10-CM | POA: Diagnosis not present

## 2021-04-27 DIAGNOSIS — Z7984 Long term (current) use of oral hypoglycemic drugs: Secondary | ICD-10-CM | POA: Diagnosis not present

## 2021-05-12 ENCOUNTER — Other Ambulatory Visit (HOSPITAL_COMMUNITY): Payer: Self-pay

## 2021-05-13 ENCOUNTER — Other Ambulatory Visit (HOSPITAL_COMMUNITY): Payer: Self-pay

## 2021-06-24 ENCOUNTER — Other Ambulatory Visit (HOSPITAL_COMMUNITY): Payer: Self-pay

## 2021-07-26 ENCOUNTER — Other Ambulatory Visit (HOSPITAL_COMMUNITY): Payer: Self-pay

## 2021-07-26 DIAGNOSIS — Z794 Long term (current) use of insulin: Secondary | ICD-10-CM | POA: Diagnosis not present

## 2021-07-26 DIAGNOSIS — E1165 Type 2 diabetes mellitus with hyperglycemia: Secondary | ICD-10-CM | POA: Diagnosis not present

## 2021-07-26 DIAGNOSIS — R1030 Lower abdominal pain, unspecified: Secondary | ICD-10-CM | POA: Diagnosis not present

## 2021-07-26 DIAGNOSIS — E669 Obesity, unspecified: Secondary | ICD-10-CM | POA: Diagnosis not present

## 2021-07-26 MED ORDER — LISINOPRIL 10 MG PO TABS
ORAL_TABLET | ORAL | 4 refills | Status: DC
  Filled 2021-07-26: qty 90, 90d supply, fill #0
  Filled 2022-03-06: qty 90, 90d supply, fill #1
  Filled 2022-05-26: qty 90, 90d supply, fill #2

## 2021-07-26 MED ORDER — FREESTYLE LITE W/DEVICE KIT
PACK | 0 refills | Status: AC
  Filled 2021-07-26: qty 1, 30d supply, fill #0

## 2021-07-26 MED ORDER — FREESTYLE LANCETS MISC
0 refills | Status: AC
  Filled 2021-07-26: qty 100, 33d supply, fill #0

## 2021-08-19 DIAGNOSIS — E1165 Type 2 diabetes mellitus with hyperglycemia: Secondary | ICD-10-CM | POA: Diagnosis not present

## 2021-08-23 DIAGNOSIS — E1165 Type 2 diabetes mellitus with hyperglycemia: Secondary | ICD-10-CM | POA: Diagnosis not present

## 2021-08-23 DIAGNOSIS — Z0000 Encounter for general adult medical examination without abnormal findings: Secondary | ICD-10-CM | POA: Diagnosis not present

## 2021-08-23 DIAGNOSIS — H401122 Primary open-angle glaucoma, left eye, moderate stage: Secondary | ICD-10-CM | POA: Diagnosis not present

## 2021-09-15 ENCOUNTER — Other Ambulatory Visit (HOSPITAL_COMMUNITY): Payer: Self-pay

## 2021-09-15 MED ORDER — INSULIN GLARGINE-YFGN 100 UNIT/ML ~~LOC~~ SOPN
PEN_INJECTOR | SUBCUTANEOUS | 3 refills | Status: DC
  Filled 2021-09-15: qty 45, 90d supply, fill #0
  Filled 2022-04-24: qty 45, 90d supply, fill #1
  Filled 2022-09-12: qty 45, 90d supply, fill #2

## 2021-09-15 MED ORDER — INSULIN LISPRO (1 UNIT DIAL) 100 UNIT/ML (KWIKPEN)
PEN_INJECTOR | SUBCUTANEOUS | 3 refills | Status: DC
  Filled 2021-09-15: qty 60, 83d supply, fill #0
  Filled 2022-09-12: qty 60, 83d supply, fill #1

## 2021-10-07 ENCOUNTER — Other Ambulatory Visit (HOSPITAL_COMMUNITY): Payer: Self-pay

## 2021-10-28 ENCOUNTER — Other Ambulatory Visit (HOSPITAL_COMMUNITY): Payer: Self-pay

## 2021-10-28 DIAGNOSIS — E1165 Type 2 diabetes mellitus with hyperglycemia: Secondary | ICD-10-CM | POA: Diagnosis not present

## 2021-10-28 DIAGNOSIS — Z794 Long term (current) use of insulin: Secondary | ICD-10-CM | POA: Diagnosis not present

## 2021-10-28 DIAGNOSIS — E669 Obesity, unspecified: Secondary | ICD-10-CM | POA: Diagnosis not present

## 2021-10-28 MED ORDER — OZEMPIC (0.25 OR 0.5 MG/DOSE) 2 MG/3ML ~~LOC~~ SOPN
PEN_INJECTOR | SUBCUTANEOUS | 4 refills | Status: AC
  Filled 2021-10-28: qty 3, 42d supply, fill #0
  Filled 2021-11-30: qty 3, 28d supply, fill #1
  Filled 2022-01-10: qty 3, 28d supply, fill #2
  Filled 2022-02-06: qty 3, 28d supply, fill #3
  Filled 2022-03-06: qty 3, 28d supply, fill #4
  Filled 2022-04-03: qty 3, 28d supply, fill #5
  Filled 2022-04-24: qty 3, 28d supply, fill #6

## 2021-10-29 ENCOUNTER — Other Ambulatory Visit (HOSPITAL_COMMUNITY): Payer: Self-pay

## 2021-11-21 ENCOUNTER — Other Ambulatory Visit (HOSPITAL_COMMUNITY): Payer: Self-pay

## 2021-11-21 DIAGNOSIS — M4725 Other spondylosis with radiculopathy, thoracolumbar region: Secondary | ICD-10-CM | POA: Diagnosis not present

## 2021-11-21 MED ORDER — MELOXICAM 15 MG PO TABS
ORAL_TABLET | ORAL | 0 refills | Status: AC
  Filled 2021-11-21: qty 30, 30d supply, fill #0

## 2021-11-24 ENCOUNTER — Other Ambulatory Visit: Payer: Self-pay | Admitting: Family Medicine

## 2021-11-24 ENCOUNTER — Ambulatory Visit
Admission: RE | Admit: 2021-11-24 | Discharge: 2021-11-24 | Disposition: A | Discharge status: Home / Self Care | Payer: 59 | Source: Ambulatory Visit | Attending: Family Medicine | Admitting: Family Medicine

## 2021-11-24 DIAGNOSIS — M4725 Other spondylosis with radiculopathy, thoracolumbar region: Secondary | ICD-10-CM

## 2021-11-24 DIAGNOSIS — M47816 Spondylosis without myelopathy or radiculopathy, lumbar region: Secondary | ICD-10-CM | POA: Diagnosis not present

## 2021-11-30 ENCOUNTER — Other Ambulatory Visit (HOSPITAL_COMMUNITY): Payer: Self-pay

## 2021-12-02 ENCOUNTER — Other Ambulatory Visit (HOSPITAL_COMMUNITY): Payer: Self-pay

## 2021-12-03 ENCOUNTER — Other Ambulatory Visit (HOSPITAL_COMMUNITY): Payer: Self-pay

## 2022-01-10 ENCOUNTER — Other Ambulatory Visit (HOSPITAL_COMMUNITY): Payer: Self-pay

## 2022-01-20 ENCOUNTER — Other Ambulatory Visit (HOSPITAL_COMMUNITY): Payer: Self-pay

## 2022-01-31 DIAGNOSIS — E1165 Type 2 diabetes mellitus with hyperglycemia: Secondary | ICD-10-CM | POA: Diagnosis not present

## 2022-01-31 DIAGNOSIS — Z794 Long term (current) use of insulin: Secondary | ICD-10-CM | POA: Diagnosis not present

## 2022-01-31 DIAGNOSIS — E669 Obesity, unspecified: Secondary | ICD-10-CM | POA: Diagnosis not present

## 2022-02-06 ENCOUNTER — Other Ambulatory Visit (HOSPITAL_COMMUNITY): Payer: Self-pay

## 2022-02-06 MED ORDER — GLUCOSE BLOOD VI STRP
ORAL_STRIP | 3 refills | Status: AC
  Filled 2022-02-06: qty 200, 67d supply, fill #0
  Filled 2022-06-20: qty 200, 67d supply, fill #1

## 2022-02-07 ENCOUNTER — Other Ambulatory Visit (HOSPITAL_COMMUNITY): Payer: Self-pay

## 2022-03-06 ENCOUNTER — Other Ambulatory Visit (HOSPITAL_COMMUNITY): Payer: Self-pay

## 2022-04-03 ENCOUNTER — Other Ambulatory Visit (HOSPITAL_COMMUNITY): Payer: Self-pay

## 2022-04-14 DIAGNOSIS — H401131 Primary open-angle glaucoma, bilateral, mild stage: Secondary | ICD-10-CM | POA: Diagnosis not present

## 2022-04-24 ENCOUNTER — Other Ambulatory Visit (HOSPITAL_COMMUNITY): Payer: Self-pay

## 2022-04-26 ENCOUNTER — Other Ambulatory Visit (HOSPITAL_COMMUNITY): Payer: Self-pay

## 2022-05-03 DIAGNOSIS — E1165 Type 2 diabetes mellitus with hyperglycemia: Secondary | ICD-10-CM | POA: Diagnosis not present

## 2022-05-03 DIAGNOSIS — E669 Obesity, unspecified: Secondary | ICD-10-CM | POA: Diagnosis not present

## 2022-05-03 DIAGNOSIS — Z794 Long term (current) use of insulin: Secondary | ICD-10-CM | POA: Diagnosis not present

## 2022-05-04 ENCOUNTER — Other Ambulatory Visit (HOSPITAL_COMMUNITY): Payer: Self-pay

## 2022-05-04 MED ORDER — OZEMPIC (1 MG/DOSE) 4 MG/3ML ~~LOC~~ SOPN
1.0000 mg | PEN_INJECTOR | SUBCUTANEOUS | 11 refills | Status: DC
  Filled 2022-05-04 – 2022-05-26 (×2): qty 3, 28d supply, fill #0
  Filled 2022-06-20: qty 3, 28d supply, fill #1
  Filled 2022-07-21: qty 3, 28d supply, fill #2
  Filled 2022-08-14: qty 3, 28d supply, fill #3
  Filled 2022-09-12: qty 3, 28d supply, fill #4
  Filled 2022-10-16: qty 3, 28d supply, fill #5
  Filled 2022-11-13: qty 3, 28d supply, fill #6
  Filled 2022-12-11: qty 3, 28d supply, fill #7
  Filled 2023-01-08: qty 3, 28d supply, fill #8
  Filled 2023-02-05: qty 3, 28d supply, fill #9
  Filled 2023-03-05: qty 3, 28d supply, fill #10
  Filled 2023-04-02: qty 3, 28d supply, fill #11

## 2022-05-06 ENCOUNTER — Other Ambulatory Visit (HOSPITAL_COMMUNITY): Payer: Self-pay

## 2022-05-11 ENCOUNTER — Other Ambulatory Visit (HOSPITAL_COMMUNITY): Payer: Self-pay

## 2022-05-11 MED ORDER — JARDIANCE 25 MG PO TABS
25.0000 mg | ORAL_TABLET | Freq: Every day | ORAL | 3 refills | Status: DC
  Filled 2022-05-11: qty 90, 90d supply, fill #0
  Filled 2022-08-14: qty 90, 90d supply, fill #1
  Filled 2022-12-01: qty 90, 90d supply, fill #2
  Filled 2023-03-05: qty 90, 90d supply, fill #3

## 2022-05-23 DIAGNOSIS — H43812 Vitreous degeneration, left eye: Secondary | ICD-10-CM | POA: Diagnosis not present

## 2022-05-26 ENCOUNTER — Other Ambulatory Visit (HOSPITAL_COMMUNITY): Payer: Self-pay

## 2022-06-20 ENCOUNTER — Other Ambulatory Visit (HOSPITAL_COMMUNITY): Payer: Self-pay

## 2022-07-07 ENCOUNTER — Other Ambulatory Visit (HOSPITAL_COMMUNITY): Payer: Self-pay

## 2022-07-07 DIAGNOSIS — H4312 Vitreous hemorrhage, left eye: Secondary | ICD-10-CM | POA: Diagnosis not present

## 2022-07-07 MED ORDER — CYCLOSPORINE 0.05 % OP EMUL
1.0000 [drp] | Freq: Two times a day (BID) | OPHTHALMIC | 4 refills | Status: AC
  Filled 2022-07-07: qty 180, 90d supply, fill #0
  Filled 2022-12-11: qty 180, 90d supply, fill #1
  Filled 2023-03-05: qty 180, 90d supply, fill #2
  Filled 2023-06-25: qty 180, 90d supply, fill #3

## 2022-07-17 DIAGNOSIS — H4312 Vitreous hemorrhage, left eye: Secondary | ICD-10-CM | POA: Diagnosis not present

## 2022-07-17 DIAGNOSIS — E113313 Type 2 diabetes mellitus with moderate nonproliferative diabetic retinopathy with macular edema, bilateral: Secondary | ICD-10-CM | POA: Diagnosis not present

## 2022-07-17 DIAGNOSIS — H43812 Vitreous degeneration, left eye: Secondary | ICD-10-CM | POA: Diagnosis not present

## 2022-07-17 DIAGNOSIS — H401132 Primary open-angle glaucoma, bilateral, moderate stage: Secondary | ICD-10-CM | POA: Diagnosis not present

## 2022-07-17 DIAGNOSIS — H43821 Vitreomacular adhesion, right eye: Secondary | ICD-10-CM | POA: Diagnosis not present

## 2022-07-21 ENCOUNTER — Other Ambulatory Visit (HOSPITAL_COMMUNITY): Payer: Self-pay

## 2022-07-27 ENCOUNTER — Other Ambulatory Visit (HOSPITAL_COMMUNITY): Payer: Self-pay

## 2022-08-04 ENCOUNTER — Other Ambulatory Visit (HOSPITAL_COMMUNITY): Payer: Self-pay

## 2022-08-04 DIAGNOSIS — Z794 Long term (current) use of insulin: Secondary | ICD-10-CM | POA: Diagnosis not present

## 2022-08-04 DIAGNOSIS — E1165 Type 2 diabetes mellitus with hyperglycemia: Secondary | ICD-10-CM | POA: Diagnosis not present

## 2022-08-04 DIAGNOSIS — E669 Obesity, unspecified: Secondary | ICD-10-CM | POA: Diagnosis not present

## 2022-08-04 MED ORDER — ATORVASTATIN CALCIUM 10 MG PO TABS
10.0000 mg | ORAL_TABLET | Freq: Every day | ORAL | 4 refills | Status: AC
  Filled 2022-08-04: qty 90, 90d supply, fill #0
  Filled 2022-11-13: qty 90, 90d supply, fill #1

## 2022-08-14 ENCOUNTER — Other Ambulatory Visit (HOSPITAL_COMMUNITY): Payer: Self-pay

## 2022-08-14 DIAGNOSIS — H43821 Vitreomacular adhesion, right eye: Secondary | ICD-10-CM | POA: Diagnosis not present

## 2022-08-14 DIAGNOSIS — H43812 Vitreous degeneration, left eye: Secondary | ICD-10-CM | POA: Diagnosis not present

## 2022-08-14 DIAGNOSIS — H401132 Primary open-angle glaucoma, bilateral, moderate stage: Secondary | ICD-10-CM | POA: Diagnosis not present

## 2022-08-14 DIAGNOSIS — E113313 Type 2 diabetes mellitus with moderate nonproliferative diabetic retinopathy with macular edema, bilateral: Secondary | ICD-10-CM | POA: Diagnosis not present

## 2022-08-14 DIAGNOSIS — H4312 Vitreous hemorrhage, left eye: Secondary | ICD-10-CM | POA: Diagnosis not present

## 2022-08-14 MED ORDER — LISINOPRIL 10 MG PO TABS
10.0000 mg | ORAL_TABLET | Freq: Every day | ORAL | 3 refills | Status: DC
  Filled 2022-08-14 – 2022-08-16 (×2): qty 90, 90d supply, fill #0
  Filled 2022-12-01: qty 90, 90d supply, fill #1
  Filled 2023-03-05: qty 90, 90d supply, fill #2
  Filled 2023-05-30 (×2): qty 90, 90d supply, fill #3

## 2022-08-15 ENCOUNTER — Other Ambulatory Visit (HOSPITAL_COMMUNITY): Payer: Self-pay

## 2022-08-16 ENCOUNTER — Other Ambulatory Visit (HOSPITAL_COMMUNITY): Payer: Self-pay

## 2022-08-22 ENCOUNTER — Other Ambulatory Visit (HOSPITAL_COMMUNITY): Payer: Self-pay

## 2022-08-24 ENCOUNTER — Other Ambulatory Visit (HOSPITAL_COMMUNITY): Payer: Self-pay

## 2022-09-08 DIAGNOSIS — H43821 Vitreomacular adhesion, right eye: Secondary | ICD-10-CM | POA: Diagnosis not present

## 2022-09-08 DIAGNOSIS — E1165 Type 2 diabetes mellitus with hyperglycemia: Secondary | ICD-10-CM | POA: Diagnosis not present

## 2022-09-08 DIAGNOSIS — H43812 Vitreous degeneration, left eye: Secondary | ICD-10-CM | POA: Diagnosis not present

## 2022-09-08 DIAGNOSIS — E669 Obesity, unspecified: Secondary | ICD-10-CM | POA: Diagnosis not present

## 2022-09-08 DIAGNOSIS — H401132 Primary open-angle glaucoma, bilateral, moderate stage: Secondary | ICD-10-CM | POA: Diagnosis not present

## 2022-09-08 DIAGNOSIS — H4312 Vitreous hemorrhage, left eye: Secondary | ICD-10-CM | POA: Diagnosis not present

## 2022-09-08 DIAGNOSIS — E113313 Type 2 diabetes mellitus with moderate nonproliferative diabetic retinopathy with macular edema, bilateral: Secondary | ICD-10-CM | POA: Diagnosis not present

## 2022-09-11 DIAGNOSIS — Z79899 Other long term (current) drug therapy: Secondary | ICD-10-CM | POA: Diagnosis not present

## 2022-09-11 DIAGNOSIS — M47816 Spondylosis without myelopathy or radiculopathy, lumbar region: Secondary | ICD-10-CM | POA: Diagnosis not present

## 2022-09-11 DIAGNOSIS — E1165 Type 2 diabetes mellitus with hyperglycemia: Secondary | ICD-10-CM | POA: Diagnosis not present

## 2022-09-11 DIAGNOSIS — E559 Vitamin D deficiency, unspecified: Secondary | ICD-10-CM | POA: Diagnosis not present

## 2022-09-11 DIAGNOSIS — Z0000 Encounter for general adult medical examination without abnormal findings: Secondary | ICD-10-CM | POA: Diagnosis not present

## 2022-09-12 ENCOUNTER — Other Ambulatory Visit (HOSPITAL_COMMUNITY): Payer: Self-pay

## 2022-10-16 ENCOUNTER — Other Ambulatory Visit (HOSPITAL_COMMUNITY): Payer: Self-pay

## 2022-10-17 ENCOUNTER — Other Ambulatory Visit: Payer: Self-pay

## 2022-11-06 DIAGNOSIS — E669 Obesity, unspecified: Secondary | ICD-10-CM | POA: Diagnosis not present

## 2022-11-06 DIAGNOSIS — Z794 Long term (current) use of insulin: Secondary | ICD-10-CM | POA: Diagnosis not present

## 2022-11-06 DIAGNOSIS — E1165 Type 2 diabetes mellitus with hyperglycemia: Secondary | ICD-10-CM | POA: Diagnosis not present

## 2022-11-11 ENCOUNTER — Other Ambulatory Visit (HOSPITAL_COMMUNITY): Payer: Self-pay

## 2022-11-11 MED ORDER — FREESTYLE LITE W/DEVICE KIT
PACK | 0 refills | Status: AC
  Filled 2022-11-11: qty 1, 365d supply, fill #0

## 2022-11-11 MED ORDER — GLUCOSE BLOOD VI STRP
ORAL_STRIP | 3 refills | Status: AC
  Filled 2022-11-11: qty 200, 67d supply, fill #0
  Filled 2023-05-01: qty 200, 67d supply, fill #1

## 2022-11-13 ENCOUNTER — Other Ambulatory Visit: Payer: Self-pay

## 2022-11-13 ENCOUNTER — Other Ambulatory Visit (HOSPITAL_COMMUNITY): Payer: Self-pay

## 2022-11-30 DIAGNOSIS — E1165 Type 2 diabetes mellitus with hyperglycemia: Secondary | ICD-10-CM | POA: Diagnosis not present

## 2022-12-01 ENCOUNTER — Other Ambulatory Visit (HOSPITAL_COMMUNITY): Payer: Self-pay

## 2022-12-11 ENCOUNTER — Other Ambulatory Visit (HOSPITAL_COMMUNITY): Payer: Self-pay

## 2022-12-12 ENCOUNTER — Other Ambulatory Visit: Payer: Self-pay

## 2022-12-12 DIAGNOSIS — E113313 Type 2 diabetes mellitus with moderate nonproliferative diabetic retinopathy with macular edema, bilateral: Secondary | ICD-10-CM | POA: Diagnosis not present

## 2022-12-12 DIAGNOSIS — H401132 Primary open-angle glaucoma, bilateral, moderate stage: Secondary | ICD-10-CM | POA: Diagnosis not present

## 2022-12-12 DIAGNOSIS — H43821 Vitreomacular adhesion, right eye: Secondary | ICD-10-CM | POA: Diagnosis not present

## 2022-12-12 DIAGNOSIS — H43812 Vitreous degeneration, left eye: Secondary | ICD-10-CM | POA: Diagnosis not present

## 2022-12-12 DIAGNOSIS — H43392 Other vitreous opacities, left eye: Secondary | ICD-10-CM | POA: Diagnosis not present

## 2022-12-29 ENCOUNTER — Other Ambulatory Visit (HOSPITAL_COMMUNITY): Payer: Self-pay

## 2022-12-29 ENCOUNTER — Other Ambulatory Visit: Payer: Self-pay

## 2022-12-29 DIAGNOSIS — H43811 Vitreous degeneration, right eye: Secondary | ICD-10-CM | POA: Diagnosis not present

## 2022-12-29 DIAGNOSIS — H401131 Primary open-angle glaucoma, bilateral, mild stage: Secondary | ICD-10-CM | POA: Diagnosis not present

## 2022-12-29 MED ORDER — LATANOPROST 0.005 % OP SOLN
1.0000 [drp] | Freq: Every day | OPHTHALMIC | 4 refills | Status: AC
  Filled 2022-12-29: qty 7.5, 75d supply, fill #0
  Filled 2023-04-02: qty 7.5, 75d supply, fill #1
  Filled 2023-09-18: qty 7.5, 75d supply, fill #2
  Filled 2023-12-14: qty 7.5, 75d supply, fill #3

## 2023-01-08 ENCOUNTER — Other Ambulatory Visit (HOSPITAL_COMMUNITY): Payer: Self-pay

## 2023-01-08 DIAGNOSIS — J019 Acute sinusitis, unspecified: Secondary | ICD-10-CM | POA: Diagnosis not present

## 2023-01-08 DIAGNOSIS — J029 Acute pharyngitis, unspecified: Secondary | ICD-10-CM | POA: Diagnosis not present

## 2023-01-08 MED ORDER — AMOXICILLIN 875 MG PO TABS
875.0000 mg | ORAL_TABLET | Freq: Two times a day (BID) | ORAL | 0 refills | Status: AC
  Filled 2023-01-08: qty 20, 10d supply, fill #0

## 2023-02-01 DIAGNOSIS — H43811 Vitreous degeneration, right eye: Secondary | ICD-10-CM | POA: Diagnosis not present

## 2023-02-05 ENCOUNTER — Other Ambulatory Visit: Payer: Self-pay

## 2023-02-05 ENCOUNTER — Other Ambulatory Visit (HOSPITAL_COMMUNITY): Payer: Self-pay

## 2023-02-06 ENCOUNTER — Other Ambulatory Visit (HOSPITAL_COMMUNITY): Payer: Self-pay

## 2023-02-06 ENCOUNTER — Other Ambulatory Visit: Payer: Self-pay

## 2023-02-06 MED ORDER — PANTOPRAZOLE SODIUM 40 MG PO TBEC
40.0000 mg | DELAYED_RELEASE_TABLET | Freq: Every day | ORAL | 0 refills | Status: AC | PRN
  Filled 2023-02-06: qty 90, 90d supply, fill #0

## 2023-03-05 ENCOUNTER — Other Ambulatory Visit: Payer: Self-pay

## 2023-03-05 ENCOUNTER — Other Ambulatory Visit (HOSPITAL_COMMUNITY): Payer: Self-pay

## 2023-04-02 ENCOUNTER — Other Ambulatory Visit (HOSPITAL_COMMUNITY): Payer: Self-pay

## 2023-05-01 ENCOUNTER — Other Ambulatory Visit (HOSPITAL_COMMUNITY): Payer: Self-pay

## 2023-05-02 ENCOUNTER — Other Ambulatory Visit (HOSPITAL_COMMUNITY): Payer: Self-pay

## 2023-05-02 MED ORDER — OZEMPIC (1 MG/DOSE) 4 MG/3ML ~~LOC~~ SOPN
1.0000 mg | PEN_INJECTOR | SUBCUTANEOUS | 11 refills | Status: DC
  Filled 2023-05-02: qty 3, 28d supply, fill #0
  Filled 2023-05-30 (×2): qty 3, 28d supply, fill #1
  Filled 2023-06-25: qty 3, 28d supply, fill #2
  Filled 2023-07-26 (×2): qty 3, 28d supply, fill #3
  Filled 2023-08-22: qty 3, 28d supply, fill #4
  Filled 2023-09-18: qty 3, 28d supply, fill #5
  Filled 2023-10-13 (×2): qty 3, 28d supply, fill #6
  Filled 2023-11-12 (×2): qty 3, 28d supply, fill #7
  Filled 2023-12-12: qty 3, 28d supply, fill #8
  Filled 2024-01-09: qty 9, 84d supply, fill #9

## 2023-05-03 ENCOUNTER — Other Ambulatory Visit (HOSPITAL_COMMUNITY): Payer: Self-pay

## 2023-05-09 DIAGNOSIS — B353 Tinea pedis: Secondary | ICD-10-CM | POA: Diagnosis not present

## 2023-05-09 DIAGNOSIS — E1165 Type 2 diabetes mellitus with hyperglycemia: Secondary | ICD-10-CM | POA: Diagnosis not present

## 2023-05-09 DIAGNOSIS — E669 Obesity, unspecified: Secondary | ICD-10-CM | POA: Diagnosis not present

## 2023-05-09 DIAGNOSIS — H04129 Dry eye syndrome of unspecified lacrimal gland: Secondary | ICD-10-CM | POA: Diagnosis not present

## 2023-05-09 DIAGNOSIS — R682 Dry mouth, unspecified: Secondary | ICD-10-CM | POA: Diagnosis not present

## 2023-05-09 DIAGNOSIS — N898 Other specified noninflammatory disorders of vagina: Secondary | ICD-10-CM | POA: Diagnosis not present

## 2023-05-09 DIAGNOSIS — Z794 Long term (current) use of insulin: Secondary | ICD-10-CM | POA: Diagnosis not present

## 2023-05-22 DIAGNOSIS — L299 Pruritus, unspecified: Secondary | ICD-10-CM | POA: Diagnosis not present

## 2023-05-22 DIAGNOSIS — L816 Other disorders of diminished melanin formation: Secondary | ICD-10-CM | POA: Diagnosis not present

## 2023-05-30 ENCOUNTER — Other Ambulatory Visit (HOSPITAL_COMMUNITY): Payer: Self-pay

## 2023-05-31 ENCOUNTER — Other Ambulatory Visit: Payer: Self-pay

## 2023-05-31 ENCOUNTER — Other Ambulatory Visit (HOSPITAL_COMMUNITY): Payer: Self-pay

## 2023-05-31 ENCOUNTER — Other Ambulatory Visit (HOSPITAL_BASED_OUTPATIENT_CLINIC_OR_DEPARTMENT_OTHER): Payer: Self-pay

## 2023-05-31 MED ORDER — INSULIN GLARGINE-YFGN 100 UNIT/ML ~~LOC~~ SOPN
PEN_INJECTOR | SUBCUTANEOUS | 3 refills | Status: AC
Start: 2023-05-30 — End: ?
  Filled 2023-05-31: qty 45, 90d supply, fill #0
  Filled 2024-01-09: qty 15, 30d supply, fill #1

## 2023-06-05 DIAGNOSIS — H401131 Primary open-angle glaucoma, bilateral, mild stage: Secondary | ICD-10-CM | POA: Diagnosis not present

## 2023-06-05 DIAGNOSIS — H04123 Dry eye syndrome of bilateral lacrimal glands: Secondary | ICD-10-CM | POA: Diagnosis not present

## 2023-06-25 ENCOUNTER — Other Ambulatory Visit (HOSPITAL_COMMUNITY): Payer: Self-pay

## 2023-06-25 ENCOUNTER — Other Ambulatory Visit: Payer: Self-pay

## 2023-06-25 MED ORDER — JARDIANCE 25 MG PO TABS
25.0000 mg | ORAL_TABLET | Freq: Every day | ORAL | 3 refills | Status: AC
  Filled 2023-06-25: qty 90, 90d supply, fill #0
  Filled 2023-09-18: qty 90, 90d supply, fill #1
  Filled 2023-12-14: qty 90, 90d supply, fill #2
  Filled 2024-04-28: qty 90, 90d supply, fill #3

## 2023-06-26 ENCOUNTER — Other Ambulatory Visit: Payer: Self-pay

## 2023-07-26 ENCOUNTER — Other Ambulatory Visit (HOSPITAL_COMMUNITY): Payer: Self-pay

## 2023-07-26 ENCOUNTER — Other Ambulatory Visit: Payer: Self-pay

## 2023-07-26 MED ORDER — INSULIN PEN NEEDLE 31G X 5 MM MISC
3 refills | Status: AC
  Filled 2023-07-26 (×2): qty 300, 75d supply, fill #0

## 2023-07-30 ENCOUNTER — Other Ambulatory Visit (HOSPITAL_COMMUNITY): Payer: Self-pay

## 2023-08-06 ENCOUNTER — Other Ambulatory Visit (HOSPITAL_COMMUNITY): Payer: Self-pay

## 2023-08-06 ENCOUNTER — Ambulatory Visit: Admitting: Dermatology

## 2023-08-06 ENCOUNTER — Encounter: Payer: Self-pay | Admitting: Dermatology

## 2023-08-06 VITALS — BP 106/68

## 2023-08-06 DIAGNOSIS — L280 Lichen simplex chronicus: Secondary | ICD-10-CM | POA: Diagnosis not present

## 2023-08-06 DIAGNOSIS — R21 Rash and other nonspecific skin eruption: Secondary | ICD-10-CM

## 2023-08-06 MED ORDER — TRIAMCINOLONE ACETONIDE 0.1 % EX OINT
1.0000 | TOPICAL_OINTMENT | Freq: Two times a day (BID) | CUTANEOUS | 2 refills | Status: AC
  Filled 2023-08-06: qty 454, 90d supply, fill #0
  Filled 2023-11-12: qty 454, 90d supply, fill #1

## 2023-08-06 NOTE — Progress Notes (Signed)
 New Patient Visit   Subjective  Mary Delgado is a 63 y.o. female who presents for the following: She noticed some hypopigmentation of her left lower leg in December. It is very itchy and seems to be spreading. She has used OTC cream that did not help. She works in oncology doing chemotherapy since 2012.  The following portions of the chart were reviewed this encounter and updated as appropriate: medications, allergies, medical history  Review of Systems:  No other skin or systemic complaints except as noted in HPI or Assessment and Plan.  Objective  Well appearing patient in no apparent distress; mood and affect are within normal limits.  A focused examination was performed of the following areas: Feet Forearms Shins Lower back  Relevant exam findings are noted in the Assessment and Plan.               Left hand, Lower back Diffuse eczematous monomorphic papules with background hypopigmenmtation  Assessment & Plan   Rash- pruritic, mottled hypopigmented eruption affecting the bilateral dorsal feet, shins, forearms, and lower back The patient presents with a pruritic, mottled hypopigmented eruption affecting the bilateral dorsal feet, shins, forearms, and lower back, which has been present for several months and is gradually spreading. The differential diagnosis includes eczema, discoid lupus erythematosus (DLE), lichen planus (LP), drug reaction,and other potential dermatologic conditions. Given the chronic and spreading nature of the eruption, further evaluation is warranted to clarify the underlying cause. To aid in diagnosis, we will proceed with two punch biopsies to obtain histopathologic samples. These biopsies will help assess for key features of the different conditions, such as inflammatory cell infiltrates, epidermal changes, and any signs of autoimmunity or keratinocyte dysregulation. We will review the biopsy results to guide treatment and management, but in the  meantime, symptomatic relief can be provided with topical treatments as necessary.  RASH Left hand, Lower back Start TMC 0.1% ointment twice daily to affected areas. Skin / nail biopsy - Left hand Type of biopsy: punch   Informed consent: discussed and consent obtained   Timeout: patient name, date of birth, surgical site, and procedure verified   Procedure prep:  Patient was prepped and draped in usual sterile fashion (the patient was cleaned and prepped) Prep type:  Isopropyl alcohol Anesthesia: the lesion was anesthetized in a standard fashion   Anesthetic:  1% lidocaine w/ epinephrine 1-100,000 buffered w/ 8.4% NaHCO3 Punch size:  4 mm Suture size:  4-0 Suture type: fast-absorbing plain gut   Hemostasis achieved with: suture, pressure and Gelfoam   Outcome: patient tolerated procedure well   Post-procedure details: sterile dressing applied and wound care instructions given   Dressing type: bandage, petrolatum and pressure dressing    Skin / nail biopsy - Lower back Type of biopsy: punch   Informed consent: discussed and consent obtained   Timeout: patient name, date of birth, surgical site, and procedure verified   Procedure prep:  Patient was prepped and draped in usual sterile fashion (the patient was cleaned and prepped) Prep type:  Isopropyl alcohol Anesthesia: the lesion was anesthetized in a standard fashion   Anesthetic:  1% lidocaine w/ epinephrine 1-100,000 buffered w/ 8.4% NaHCO3 Punch size:  4 mm Hemostasis achieved with: pressure and Gelfoam   Outcome: patient tolerated procedure well   Post-procedure details: sterile dressing applied and wound care instructions given   Dressing type: bandage, petrolatum and pressure dressing   Specimen 1 - Surgical pathology Differential Diagnosis: Atopic Derm vs DLE vs drug  rxn vs LP vs other   Check Margins: No  Specimen 2 - Surgical pathology Differential Diagnosis: Atopic Derm vs DLE vs drug rxn vs LP vs other  Check  Margins: No  Return for Pending biopsy results.  I, Eliot Guernsey, CMA, am acting as scribe for Deneise Finlay, MD .   Documentation: I have reviewed the above documentation for accuracy and completeness, and I agree with the above.  Deneise Finlay, MD

## 2023-08-06 NOTE — Patient Instructions (Signed)

## 2023-08-07 ENCOUNTER — Other Ambulatory Visit (HOSPITAL_COMMUNITY): Payer: Self-pay

## 2023-08-07 DIAGNOSIS — E1165 Type 2 diabetes mellitus with hyperglycemia: Secondary | ICD-10-CM | POA: Diagnosis not present

## 2023-08-07 DIAGNOSIS — E669 Obesity, unspecified: Secondary | ICD-10-CM | POA: Diagnosis not present

## 2023-08-07 DIAGNOSIS — Z794 Long term (current) use of insulin: Secondary | ICD-10-CM | POA: Diagnosis not present

## 2023-08-07 MED ORDER — FREESTYLE LITE W/DEVICE KIT
PACK | 0 refills | Status: AC
Start: 2023-08-07 — End: ?
  Filled 2023-08-07: qty 1, 1d supply, fill #0

## 2023-08-08 ENCOUNTER — Other Ambulatory Visit: Payer: Self-pay

## 2023-08-08 ENCOUNTER — Other Ambulatory Visit (HOSPITAL_COMMUNITY): Payer: Self-pay

## 2023-08-09 LAB — SURGICAL PATHOLOGY

## 2023-08-10 ENCOUNTER — Ambulatory Visit: Payer: Self-pay | Admitting: Dermatology

## 2023-08-13 NOTE — Telephone Encounter (Signed)
-----   Message from [facility] PACI sent at 08/10/2023  9:56 AM EDT ----- Biopsies show lichenoid dermatitis; rash either Lichen Planus or Medication induced; have her schedule a follow up to discuss treatment options

## 2023-08-13 NOTE — Telephone Encounter (Signed)
 Spoke with pt and gave her results and recommendations

## 2023-08-14 ENCOUNTER — Ambulatory Visit (INDEPENDENT_AMBULATORY_CARE_PROVIDER_SITE_OTHER): Admitting: Dermatology

## 2023-08-14 ENCOUNTER — Encounter: Payer: Self-pay | Admitting: Dermatology

## 2023-08-14 VITALS — BP 112/73 | HR 92

## 2023-08-14 DIAGNOSIS — L439 Lichen planus, unspecified: Secondary | ICD-10-CM

## 2023-08-14 NOTE — Progress Notes (Signed)
   Follow-Up Visit   Subjective  Mary Delgado is a 63 y.o. female who presents for the following: Discuss recent pathology results and treatment options of biopsied showing lichenoid dermatitis. She states that she has been using triamcinolone  ointment BID as needed.   The following portions of the chart were reviewed this encounter and updated as appropriate: medications, allergies, medical history  Review of Systems:  No other skin or systemic complaints except as noted in HPI or Assessment and Plan.  Objective  Well appearing patient in no apparent distress; mood and affect are within normal limits.  A focused examination was performed of the following areas: Bilateral lower legs Wrists Oral Cavity  Relevant exam findings are noted in the Assessment and Plan.    Assessment & Plan   LICHEN PLANUS -patient advised to continue to use triamcinolone  to affected areas.  -discussed treating topically. Will reassess in 3-4 months and proceed to oral medication if goals are not met.  Pt was Counseled on the Following: -Lichen planus (LP) is an inflammatory disorder of the skin and mucous membranes with no known cause. It appears as pruritic, violaceous papules and plaques most commonly found on the wrists, lower back, and ankles.  -Lichen planus usually goes away on its own. If symptoms are bothersome, topical creams and oral antihistamines may help.   Counseled the patient regarding the diagnosis of lichen planus, a chronic inflammatory condition that can affect the skin, mucous membranes, nails, and scalp. Explained that the cause is often unknown but may be associated with immune system dysregulation, certain medications, or, in rare cases, underlying infections such as hepatitis C. Discussed common symptoms including pruritic, flat-topped, violaceous papules on the skin, as well as possible discomfort or burning in the mouth or other mucosal sites if involved. Reassured the patient that  lichen planus is not contagious. Reviewed treatment options, which may include topical corticosteroids for skin lesions, antihistamines for itch relief, and, in more extensive or resistant cases, systemic therapy such as oral steroids or immunosuppressants. Emphasized the importance of follow-up to monitor treatment response and evaluate for any signs of mucosal involvement or secondary complications. Patient expressed understanding and agreed to the treatment plan. Patient's current meds are unlikely culprits.      Return in about 3 months (around 11/14/2023) for follow up.  Maurine Sovereign, RN, am acting as scribe for Deneise Finlay, MD .   Documentation: I have reviewed the above documentation for accuracy and completeness, and I agree with the above.  Deneise Finlay, MD

## 2023-08-14 NOTE — Patient Instructions (Signed)
 Pt was Counseled on the Following: -Lichen planus (LP) is an inflammatory disorder of the skin and mucous membranes with no known cause. It appears as pruritic, violaceous papules and plaques most commonly found on the wrists, lower back, and ankles.  -Lichen planus usually goes away on its own. If symptoms are bothersome, topical creams and oral antihistamines may help.     Important Information  Due to recent changes in healthcare laws, you may see results of your pathology and/or laboratory studies on MyChart before the doctors have had a chance to review them. We understand that in some cases there may be results that are confusing or concerning to you. Please understand that not all results are received at the same time and often the doctors may need to interpret multiple results in order to provide you with the best plan of care or course of treatment. Therefore, we ask that you please give us  2 business days to thoroughly review all your results before contacting the office for clarification. Should we see a critical lab result, you will be contacted sooner.   If You Need Anything After Your Visit  If you have any questions or concerns for your doctor, please call our main line at [phone] If no one answers, please leave a voicemail as directed and we will return your call as soon as possible. Messages left after 4 pm will be answered the following business day.   You may also send us  a message via MyChart. We typically respond to MyChart messages within 1-2 business days.  For prescription refills, please ask your pharmacy to contact our office. Our fax number is [phone].  If you have an urgent issue when the clinic is closed that cannot wait until the next business day, you can page your doctor at the number below.    Please note that while we do our best to be available for urgent issues outside of office hours, we are not available 24/7.   If you have an urgent issue and  are unable to reach us , you may choose to seek medical care at your doctor's office, retail clinic, urgent care center, or emergency room.  If you have a medical emergency, please immediately call 911 or go to the emergency department. In the event of inclement weather, please call our main line at [phone] for an update on the status of any delays or closures.  Dermatology Medication Tips: Please keep the boxes that topical medications come in in order to help keep track of the instructions about where and how to use these. Pharmacies typically print the medication instructions only on the boxes and not directly on the medication tubes.   If your medication is too expensive, please contact our office at [phone] or send us  a message through MyChart.   We are unable to tell what your co-pay for medications will be in advance as this is different depending on your insurance coverage. However, we may be able to find a substitute medication at lower cost or fill out paperwork to get insurance to cover a needed medication.   If a prior authorization is required to get your medication covered by your insurance company, please allow us  1-2 business days to complete this process.  Drug prices often vary depending on where the prescription is filled and some pharmacies may offer cheaper prices.  The website www.goodrx.com contains coupons for medications through different pharmacies. The prices here do not account for what the cost may be with  help from insurance (it may be cheaper with your insurance), but the website can give you the price if you did not use any insurance.  - You can print the associated coupon and take it with your prescription to the pharmacy.  - You may also stop by our office during regular business hours and pick up a GoodRx coupon card.  - If you need your prescription sent electronically to a different pharmacy, notify our office through [facility] or by phone at  [phone]

## 2023-08-22 ENCOUNTER — Other Ambulatory Visit (HOSPITAL_COMMUNITY): Payer: Self-pay

## 2023-09-14 DIAGNOSIS — E559 Vitamin D deficiency, unspecified: Secondary | ICD-10-CM | POA: Diagnosis not present

## 2023-09-14 DIAGNOSIS — E1165 Type 2 diabetes mellitus with hyperglycemia: Secondary | ICD-10-CM | POA: Diagnosis not present

## 2023-09-14 DIAGNOSIS — Z79899 Other long term (current) drug therapy: Secondary | ICD-10-CM | POA: Diagnosis not present

## 2023-09-17 ENCOUNTER — Other Ambulatory Visit (HOSPITAL_COMMUNITY)
Admission: RE | Admit: 2023-09-17 | Discharge: 2023-09-17 | Disposition: A | Discharge status: Home / Self Care | Source: Ambulatory Visit | Attending: Family Medicine | Admitting: Family Medicine

## 2023-09-17 ENCOUNTER — Other Ambulatory Visit: Payer: Self-pay | Admitting: Family Medicine

## 2023-09-17 DIAGNOSIS — Z79899 Other long term (current) drug therapy: Secondary | ICD-10-CM | POA: Diagnosis not present

## 2023-09-17 DIAGNOSIS — Z01411 Encounter for gynecological examination (general) (routine) with abnormal findings: Secondary | ICD-10-CM | POA: Diagnosis not present

## 2023-09-17 DIAGNOSIS — L280 Lichen simplex chronicus: Secondary | ICD-10-CM | POA: Diagnosis not present

## 2023-09-17 DIAGNOSIS — N8110 Cystocele, unspecified: Secondary | ICD-10-CM | POA: Diagnosis not present

## 2023-09-17 DIAGNOSIS — Z0000 Encounter for general adult medical examination without abnormal findings: Secondary | ICD-10-CM | POA: Diagnosis not present

## 2023-09-17 DIAGNOSIS — E559 Vitamin D deficiency, unspecified: Secondary | ICD-10-CM | POA: Diagnosis not present

## 2023-09-17 DIAGNOSIS — L819 Disorder of pigmentation, unspecified: Secondary | ICD-10-CM | POA: Diagnosis not present

## 2023-09-17 DIAGNOSIS — E1165 Type 2 diabetes mellitus with hyperglycemia: Secondary | ICD-10-CM | POA: Diagnosis not present

## 2023-09-18 ENCOUNTER — Other Ambulatory Visit (HOSPITAL_COMMUNITY): Payer: Self-pay

## 2023-09-18 ENCOUNTER — Other Ambulatory Visit: Payer: Self-pay

## 2023-09-18 LAB — CYTOLOGY - PAP
Comment: NEGATIVE
Diagnosis: NEGATIVE
High risk HPV: NEGATIVE

## 2023-09-19 ENCOUNTER — Other Ambulatory Visit (HOSPITAL_COMMUNITY): Payer: Self-pay

## 2023-09-19 ENCOUNTER — Other Ambulatory Visit: Payer: Self-pay

## 2023-09-19 MED ORDER — LISINOPRIL 10 MG PO TABS
10.0000 mg | ORAL_TABLET | Freq: Every day | ORAL | 3 refills | Status: AC
  Filled 2023-09-19: qty 90, 90d supply, fill #0
  Filled 2023-12-14: qty 90, 90d supply, fill #1
  Filled 2024-04-28: qty 90, 90d supply, fill #2

## 2023-09-26 DIAGNOSIS — N841 Polyp of cervix uteri: Secondary | ICD-10-CM | POA: Diagnosis not present

## 2023-09-26 DIAGNOSIS — N8110 Cystocele, unspecified: Secondary | ICD-10-CM | POA: Diagnosis not present

## 2023-10-08 DIAGNOSIS — H401131 Primary open-angle glaucoma, bilateral, mild stage: Secondary | ICD-10-CM | POA: Diagnosis not present

## 2023-10-08 DIAGNOSIS — H04123 Dry eye syndrome of bilateral lacrimal glands: Secondary | ICD-10-CM | POA: Diagnosis not present

## 2023-10-13 ENCOUNTER — Other Ambulatory Visit (HOSPITAL_COMMUNITY): Payer: Self-pay

## 2023-10-23 ENCOUNTER — Other Ambulatory Visit (HOSPITAL_COMMUNITY): Payer: Self-pay

## 2023-11-12 ENCOUNTER — Other Ambulatory Visit (HOSPITAL_COMMUNITY): Payer: Self-pay

## 2023-11-12 ENCOUNTER — Ambulatory Visit: Admitting: Dermatology

## 2023-11-13 ENCOUNTER — Other Ambulatory Visit: Payer: Self-pay

## 2023-11-29 ENCOUNTER — Encounter: Payer: Self-pay | Admitting: Dermatology

## 2023-11-29 ENCOUNTER — Ambulatory Visit (INDEPENDENT_AMBULATORY_CARE_PROVIDER_SITE_OTHER): Admitting: Dermatology

## 2023-11-29 VITALS — BP 134/76

## 2023-11-29 DIAGNOSIS — L439 Lichen planus, unspecified: Secondary | ICD-10-CM

## 2023-11-29 DIAGNOSIS — L281 Prurigo nodularis: Secondary | ICD-10-CM

## 2023-11-29 DIAGNOSIS — L299 Pruritus, unspecified: Secondary | ICD-10-CM

## 2023-11-29 DIAGNOSIS — R21 Rash and other nonspecific skin eruption: Secondary | ICD-10-CM

## 2023-11-29 NOTE — Patient Instructions (Signed)

## 2023-11-29 NOTE — Progress Notes (Signed)
   Follow-Up Visit   Subjective  Mary Delgado is a 63 y.o. female who presents for the following: Lichen Planus  Patient present today for follow up visit for Lichen Planus. Patient was last evaluated on 08/14/2023. At this visit patient was advised to continue using triamcinolone  to affected areas. Patient reports sxs are getting better slowly. Patient denies medication changes.  The following portions of the chart were reviewed this encounter and updated as appropriate: medications, allergies, medical history  Review of Systems:  No other skin or systemic complaints except as noted in HPI or Assessment and Plan.  Objective  Well appearing patient in no apparent distress; mood and affect are within normal limits.  A focused examination was performed of the following areas: Legs, Hands, Back  Relevant exam findings are noted in the Assessment and Plan.    Assessment & Plan   PRURIGO NODULARIS/LICHEN SIMPLEX CHRONICUS Exam: Excoriated papules and nodules at hands, back, legs and arms. <20 + nodules noted  flared  Lichen simplex chronicus [facility]) is a persistent itchy area of thickened skin that is induced by chronic rubbing and/or scratching (chronic dermatitis).  These areas may be pink, hyperpigmented and may have excoriations and bumps (prurigo nodules-PN).  PN/LSC is commonly observed in uncontrolled atopic dermatitis and other forms of eczema, and in other itchy skin conditions (eg, insect bites, scabies).  Sometimes it is not possible to know initial cause of PN/LSC if it has been present for a long time.  It generally responds well to treatment with high potency topical steroids.  It is important to stop rubbing/scratching the area in order to break the itch-scratch-rash-itch cycle, in order for the rash to resolve.   Treatment Plan: Will initiate Nemluvio Enrollment.   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 She has failed to respond adequately to topical corticosteroids. Given the severity of her symptoms and refractory nature of her condition, I discussed starting Nemluvio, a treatment that targets pruritus and may help alleviate her itching and improve her skin lesions. I explained the potential benefits, risks, and the importance of monitoring for side effects during treatment. The patient understood the information and is willing to consider Nemluvio as the next step if symptoms persist.   Pt is not a candidate for methotrexate or cyclosporine  due to inability for follow up labs and contraindications with other medications. Pt has no access to a light box for phototherapy.  Due to the progressive and chronic nature of her psoriasis, patient has tried and failed numerous topicals creams as noted above, the next best therapeutic option is a systemic therapy. It is medically necessary to help improve her quality of life.    Return in about 2 months (around 01/29/2024) for Prurigo follow up .  Mary Rollene Gobble, RN, am acting as scribe for RUFUS CHRISTELLA HOLY, MD .   Documentation: I have reviewed the above documentation for accuracy and completeness, and I agree with the above.  RUFUS CHRISTELLA HOLY, MD

## 2023-12-12 ENCOUNTER — Other Ambulatory Visit (HOSPITAL_COMMUNITY): Payer: Self-pay

## 2023-12-14 ENCOUNTER — Other Ambulatory Visit (HOSPITAL_COMMUNITY): Payer: Self-pay

## 2023-12-24 ENCOUNTER — Telehealth: Payer: Self-pay

## 2023-12-24 NOTE — Telephone Encounter (Signed)
 [facility] has reached out to us  to notify that they have not been able to reach the patient in regards to her medication. I left the patient a message to call the pharmacy at [phone].

## 2023-12-24 NOTE — Telephone Encounter (Signed)
 I reached patient in regards to her VM asking that we cancel the [facility] prescription. She stated that it was very expensive and that she was no interested. I called her back stating that we were in the appeal process with her insurance company. She continues to want the prescription discontinued. I contacted [facility] and have advised them to discontinue the rx.

## 2023-12-27 ENCOUNTER — Other Ambulatory Visit: Payer: Self-pay

## 2023-12-30 ENCOUNTER — Other Ambulatory Visit (HOSPITAL_COMMUNITY): Payer: Self-pay

## 2024-01-09 ENCOUNTER — Other Ambulatory Visit (HOSPITAL_COMMUNITY): Payer: Self-pay

## 2024-01-09 ENCOUNTER — Other Ambulatory Visit: Payer: Self-pay

## 2024-01-09 ENCOUNTER — Encounter (HOSPITAL_COMMUNITY): Payer: Self-pay

## 2024-01-09 ENCOUNTER — Other Ambulatory Visit (HOSPITAL_BASED_OUTPATIENT_CLINIC_OR_DEPARTMENT_OTHER): Payer: Self-pay

## 2024-01-10 ENCOUNTER — Other Ambulatory Visit: Payer: Self-pay

## 2024-01-10 ENCOUNTER — Other Ambulatory Visit (HOSPITAL_COMMUNITY): Payer: Self-pay

## 2024-01-10 MED ORDER — INSULIN LISPRO (1 UNIT DIAL) 100 UNIT/ML (KWIKPEN)
10.0000 [IU] | PEN_INJECTOR | Freq: Three times a day (TID) | SUBCUTANEOUS | 3 refills | Status: AC
  Filled 2024-01-10: qty 27, 90d supply, fill #0

## 2024-02-05 DIAGNOSIS — Z794 Long term (current) use of insulin: Secondary | ICD-10-CM | POA: Diagnosis not present

## 2024-02-05 DIAGNOSIS — E669 Obesity, unspecified: Secondary | ICD-10-CM | POA: Diagnosis not present

## 2024-02-05 DIAGNOSIS — E1165 Type 2 diabetes mellitus with hyperglycemia: Secondary | ICD-10-CM | POA: Diagnosis not present

## 2024-04-01 ENCOUNTER — Other Ambulatory Visit (HOSPITAL_COMMUNITY): Payer: Self-pay

## 2024-04-03 ENCOUNTER — Other Ambulatory Visit (HOSPITAL_COMMUNITY): Payer: Self-pay

## 2024-04-03 MED ORDER — CYCLOSPORINE 0.05 % OP EMUL
1.0000 [drp] | Freq: Two times a day (BID) | OPHTHALMIC | 4 refills | Status: AC
  Filled 2024-04-03: qty 180, 90d supply, fill #0

## 2024-04-03 MED ORDER — OZEMPIC (1 MG/DOSE) 4 MG/3ML ~~LOC~~ SOPN
1.0000 mg | PEN_INJECTOR | SUBCUTANEOUS | 11 refills | Status: AC
  Filled 2024-04-03: qty 3, 28d supply, fill #0
  Filled 2024-04-28: qty 3, 28d supply, fill #1

## 2024-04-04 ENCOUNTER — Other Ambulatory Visit (HOSPITAL_COMMUNITY): Payer: Self-pay

## 2024-04-10 ENCOUNTER — Other Ambulatory Visit (HOSPITAL_COMMUNITY): Payer: Self-pay

## 2024-04-28 ENCOUNTER — Other Ambulatory Visit: Payer: Self-pay

## 2024-04-28 ENCOUNTER — Other Ambulatory Visit (HOSPITAL_COMMUNITY): Payer: Self-pay

## 2024-04-29 ENCOUNTER — Other Ambulatory Visit (HOSPITAL_COMMUNITY): Payer: Self-pay

## 2024-04-29 ENCOUNTER — Other Ambulatory Visit: Payer: Self-pay

## 2024-04-29 MED ORDER — LATANOPROST 0.005 % OP SOLN
1.0000 [drp] | Freq: Every day | OPHTHALMIC | 4 refills | Status: AC
  Filled 2024-04-29: qty 10, 100d supply, fill #0

## 2024-04-30 ENCOUNTER — Other Ambulatory Visit (HOSPITAL_COMMUNITY): Payer: Self-pay

## 2024-05-01 ENCOUNTER — Other Ambulatory Visit (HOSPITAL_COMMUNITY): Payer: Self-pay
